# Patient Record
Sex: Male | Born: 1980 | Race: White | Hispanic: No | Marital: Single | Smoking: Current every day smoker
Health system: Southern US, Community
[De-identification: ages and names within clinical notes are randomized; demographics above are authoritative.]

## PROBLEM LIST (undated history)

## (undated) SURGICAL SUPPLY — 3 items
NDL BIOPSY 14X6 SOFT TISS (NEEDLE) IMPLANT
SOLN 0.9% NACL 1000 ML (IV SOLUTION) ×6 IMPLANT
SOLN STERILE WATER 1000 ML (IV SOLUTION) ×1 IMPLANT

---

## 2006-08-15 ENCOUNTER — Emergency Department: Payer: Self-pay | Admitting: Emergency Medicine

## 2008-02-22 ENCOUNTER — Inpatient Hospital Stay: Payer: Self-pay | Admitting: Psychiatry

## 2008-05-14 ENCOUNTER — Emergency Department: Payer: Self-pay | Admitting: Emergency Medicine

## 2008-06-29 ENCOUNTER — Emergency Department: Payer: Self-pay | Admitting: Emergency Medicine

## 2012-04-29 ENCOUNTER — Emergency Department: Payer: Self-pay | Admitting: Emergency Medicine

## 2013-02-07 ENCOUNTER — Emergency Department: Payer: Self-pay | Admitting: Emergency Medicine

## 2013-07-29 ENCOUNTER — Encounter (HOSPITAL_COMMUNITY): Payer: Self-pay | Admitting: Emergency Medicine

## 2013-07-29 ENCOUNTER — Emergency Department (HOSPITAL_COMMUNITY)
Admission: EM | Admit: 2013-07-29 | Discharge: 2013-07-29 | Disposition: A | Discharge status: Home / Self Care | Payer: Medicaid Other | Attending: Emergency Medicine | Admitting: Emergency Medicine

## 2013-07-29 ENCOUNTER — Emergency Department (HOSPITAL_COMMUNITY): Payer: Medicaid Other

## 2013-07-29 DIAGNOSIS — S0180XA Unspecified open wound of other part of head, initial encounter: Secondary | ICD-10-CM | POA: Insufficient documentation

## 2013-07-29 DIAGNOSIS — S99929A Unspecified injury of unspecified foot, initial encounter: Secondary | ICD-10-CM

## 2013-07-29 DIAGNOSIS — Z23 Encounter for immunization: Secondary | ICD-10-CM | POA: Insufficient documentation

## 2013-07-29 DIAGNOSIS — S8990XA Unspecified injury of unspecified lower leg, initial encounter: Secondary | ICD-10-CM | POA: Insufficient documentation

## 2013-07-29 DIAGNOSIS — S1093XA Contusion of unspecified part of neck, initial encounter: Secondary | ICD-10-CM

## 2013-07-29 DIAGNOSIS — S058X9A Other injuries of unspecified eye and orbit, initial encounter: Secondary | ICD-10-CM | POA: Insufficient documentation

## 2013-07-29 DIAGNOSIS — F17200 Nicotine dependence, unspecified, uncomplicated: Secondary | ICD-10-CM | POA: Insufficient documentation

## 2013-07-29 DIAGNOSIS — S0083XA Contusion of other part of head, initial encounter: Secondary | ICD-10-CM | POA: Insufficient documentation

## 2013-07-29 DIAGNOSIS — Y92410 Unspecified street and highway as the place of occurrence of the external cause: Secondary | ICD-10-CM | POA: Insufficient documentation

## 2013-07-29 DIAGNOSIS — S060X0A Concussion without loss of consciousness, initial encounter: Secondary | ICD-10-CM

## 2013-07-29 DIAGNOSIS — IMO0002 Reserved for concepts with insufficient information to code with codable children: Secondary | ICD-10-CM | POA: Insufficient documentation

## 2013-07-29 DIAGNOSIS — S0003XA Contusion of scalp, initial encounter: Secondary | ICD-10-CM | POA: Insufficient documentation

## 2013-07-29 DIAGNOSIS — Y9389 Activity, other specified: Secondary | ICD-10-CM | POA: Insufficient documentation

## 2013-07-29 DIAGNOSIS — T07XXXA Unspecified multiple injuries, initial encounter: Secondary | ICD-10-CM

## 2013-07-29 DIAGNOSIS — S99919A Unspecified injury of unspecified ankle, initial encounter: Secondary | ICD-10-CM

## 2013-07-29 DIAGNOSIS — S0993XA Unspecified injury of face, initial encounter: Secondary | ICD-10-CM | POA: Insufficient documentation

## 2013-07-29 DIAGNOSIS — S199XXA Unspecified injury of neck, initial encounter: Secondary | ICD-10-CM

## 2013-07-29 DIAGNOSIS — L089 Local infection of the skin and subcutaneous tissue, unspecified: Secondary | ICD-10-CM

## 2013-07-29 MED ORDER — TETANUS-DIPHTH-ACELL PERTUSSIS 5-2.5-18.5 LF-MCG/0.5 IM SUSP
0.5000 mL | Freq: Once | INTRAMUSCULAR | Status: AC
  Administered 2013-07-29: 0.5 mL via INTRAMUSCULAR
  Filled 2013-07-29: qty 0.5

## 2013-07-29 MED ORDER — TRAMADOL HCL 50 MG PO TABS: 50.0000 mg | ORAL_TABLET | Freq: Four times a day (QID) | ORAL | Status: DC | PRN

## 2013-07-29 MED ORDER — MORPHINE SULFATE 4 MG/ML IJ SOLN
4.0000 mg | Freq: Once | INTRAMUSCULAR | Status: AC
  Administered 2013-07-29: 4 mg via INTRAMUSCULAR
  Filled 2013-07-29: qty 1

## 2013-07-29 MED ORDER — CEPHALEXIN 500 MG PO CAPS: 500.0000 mg | ORAL_CAPSULE | Freq: Two times a day (BID) | ORAL | Status: DC

## 2013-07-29 MED ORDER — TRAMADOL HCL 50 MG PO TABS
50.0000 mg | ORAL_TABLET | Freq: Once | ORAL | Status: AC
  Administered 2013-07-29: 50 mg via ORAL
  Filled 2013-07-29: qty 1

## 2013-07-29 MED ORDER — OXYCODONE-ACETAMINOPHEN 5-325 MG PO TABS: 1.0000 | ORAL_TABLET | Freq: Four times a day (QID) | ORAL | Status: DC | PRN

## 2013-07-29 NOTE — ED Notes (Signed)
PA Jen at bedside.  
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
## 2013-07-29 NOTE — Discharge Instructions (Signed)
Please follow up with your primary care physician in 1-2 days. If you do not have one please call one listed below. Please have some recheck your right ear wound in 1-2 days to ensure it is improving. Please take pain medication and/or muscle relaxants as prescribed and as needed for pain. Please do not drive on narcotic pain medication or on muscle relaxants. Please take your antibiotic until completion.  Please read all discharge instructions and return precautions.     Concussion, Adult A concussion, or closed-head injury, is a brain injury caused by a direct blow to the head or by a quick and sudden movement (jolt) of the head or neck. Concussions are usually not life-threatening. Even so, the effects of a concussion can be serious. If you have had a concussion before, you are more likely to experience concussion-like symptoms after a direct blow to the head.  CAUSES   Direct blow to the head, such as from running into another player during a soccer game, being hit in a fight, or hitting your head on a hard surface.  A jolt of the head or neck that causes the brain to move back and forth inside the skull, such as in a car crash. SIGNS AND SYMPTOMS  The signs of a concussion can be hard to notice. Early on, they may be missed by you, family members, and health care providers. You may look fine but act or feel differently. Symptoms are usually temporary, but they may last for days, weeks, or even longer. Some symptoms may appear right away while others may not show up for hours or days. Every head injury is different. Symptoms include:   Mild to moderate headaches that will not go away.  A feeling of pressure inside your head.  Having more trouble than usual:   Learning or remembering things you have heard.  Answering questions.  Paying attention or concentrating.   Organizing daily tasks.   Making decisions and solving problems.   Slowness in thinking, acting or reacting,  speaking, or reading.   Getting lost or being easily confused.   Feeling tired all the time or lacking energy (fatigued).   Feeling drowsy.   Sleep disturbances.   Sleeping more than usual.   Sleeping less than usual.   Trouble falling asleep.   Trouble sleeping (insomnia).   Loss of balance or feeling lightheaded or dizzy.   Nausea or vomiting.   Numbness or tingling.   Increased sensitivity to:   Sounds.   Lights.   Distractions.   Vision problems or eyes that tire easily.   Diminished sense of taste or smell.   Ringing in the ears.   Mood changes such as feeling sad or anxious.   Becoming easily irritated or angry for little or no reason.   Lack of motivation.  Seeing or hearing things other people do not see or hear (hallucinations). DIAGNOSIS  Your health care provider can usually diagnose a concussion based on a description of your injury and symptoms. He or she will ask whether you passed out (lost consciousness) and whether you are having trouble remembering events that happened right before and during your injury.  Your evaluation might include:   A brain scan to look for signs of injury to the brain. Even if the test shows no injury, you may still have a concussion.   Blood tests to be sure other problems are not present. TREATMENT   Concussions are usually treated in an emergency department, in  urgent care, or at a clinic. You may need to stay in the hospital overnight for further treatment.   Tell your health care provider if you are taking any medicines, including prescription medicines, over-the-counter medicines, and natural remedies. Some medicines, such as blood thinners (anticoagulants) and aspirin, may increase the chance of complications. Also tell your health care provider whether you have had alcohol or are taking illegal drugs. This information may affect treatment.  Your health care provider will send you home  with important instructions to follow.  How fast you will recover from a concussion depends on many factors. These factors include how severe your concussion is, what part of your brain was injured, your age, and how healthy you were before the concussion.  Most people with mild injuries recover fully. Recovery can take time. In general, recovery is slower in older persons. Also, persons who have had a concussion in the past or have other medical problems may find that it takes longer to recover from their current injury. HOME CARE INSTRUCTIONS  General Instructions  Carefully follow the directions your health care provider gave you.  Only take over-the-counter or prescription medicines for pain, discomfort, or fever as directed by your health care provider.  Take only those medicines that your health care provider has approved.  Do not drink alcohol until your health care provider says you are well enough to do so. Alcohol and certain other drugs may slow your recovery and can put you at risk of further injury.  If it is harder than usual to remember things, write them down.  If you are easily distracted, try to do one thing at a time. For example, do not try to watch TV while fixing dinner.  Talk with family members or close friends when making important decisions.  Keep all follow-up appointments. Repeated evaluation of your symptoms is recommended for your recovery.  Watch your symptoms and tell others to do the same. Complications sometimes occur after a concussion. Older adults with a brain injury may have a higher risk of serious complications such as of a blood clot on the brain.  Tell your teachers, school nurse, school counselor, coach, athletic trainer, or work Production designer, theatre/television/filmmanager about your injury, symptoms, and restrictions. Tell them about what you can or cannot do. They should watch for:   Increased problems with attention or concentration.   Increased difficulty remembering or  learning new information.   Increased time needed to complete tasks or assignments.   Increased irritability or decreased ability to cope with stress.   Increased symptoms.   Rest. Rest helps the brain to heal. Make sure you:  Get plenty of sleep at night. Avoid staying up late at night.  Keep the same bedtime hours on weekends and weekdays.  Rest during the day. Take daytime naps or rest breaks when you feel tired.  Limit activities that require a lot of thought or concentration. These includes   Doing homework or job-related work.   Watching TV.   Working on the computer.  Avoid any situation where there is potential for another head injury (football, hockey, soccer, basketball, martial arts, downhill snow sports and horseback riding). Your condition will get worse every time you experience a concussion. You should avoid these activities until you are evaluated by the appropriate follow-up caregivers. Returning To Your Regular Activities You will need to return to your normal activities slowly, not all at once. You must give your body and brain enough time for recovery.  Do not return to sports or other athletic activities until your health care provider tells you it is safe to do so.  Ask your health care provider when you can drive, ride a bicycle, or operate heavy machinery. Your ability to react may be slower after a brain injury. Never do these activities if you are dizzy.  Ask your health care provider about when you can return to work or school. Preventing Another Concussion It is very important to avoid another brain injury, especially before you have recovered. In rare cases, another injury can lead to permanent brain damage, brain swelling, or death. The risk of this is greatest during the first 7 10 days after a head injury. Avoid injuries by:   Wearing a seat belt when riding in a car.   Drinking alcohol only in moderation.   Wearing a helmet when  biking, skiing, skateboarding, skating, or doing similar activities.  Avoiding activities that could lead to a second concussion, such as contact or recreational sports, until your health care provider says it is OK.  Taking safety measures in your home.   Remove clutter and tripping hazards from floors and stairways.   Use grab bars in bathrooms and handrails by stairs.   Place non-slip mats on floors and in bathtubs.   Improve lighting in dim areas. SEEK MEDICAL CARE IF:   You have increased problems paying attention or concentrating.   You have increased difficulty remembering or learning new information.   You need more time to complete tasks or assignments than before.   You have increased irritability or decreased ability to cope with stress.  You have more symptoms than before. Seek medical care if you have any of the following symptoms for more than 2 weeks after your injury:   Lasting (chronic) headaches.   Dizziness or balance problems.   Nausea.  Vision problems.   Increased sensitivity to noise or light.   Depression or mood swings.   Anxiety or irritability.   Memory problems.   Difficulty concentrating or paying attention.   Sleep problems.   Feeling tired all the time. SEEK IMMEDIATE MEDICAL CARE IF:   You have severe or worsening headaches. These may be a sign of a blood clot in the brain.  You have weakness (even if only in one hand, leg, or part of the face).  You have numbness.  You have decreased coordination.   You vomit repeatedly.  You have increased sleepiness.  One pupil is larger than the other.   You have convulsions.   You have slurred speech.   You have increased confusion. This may be a sign of a blood clot in the brain.  You have increased restlessness, agitation, or irritability.   You are unable to recognize people or places.   You have neck pain.   It is difficult to wake you up.    You have unusual behavior changes.   You lose consciousness. MAKE SURE YOU:   Understand these instructions.  Will watch your condition.  Will get help right away if you are not doing well or get worse. Document Released: 04/24/2003 Document Revised: 10/04/2012 Document Reviewed: 08/24/2012 [facility] Patient Information 2014 [place], [place].    Motor Vehicle Collision  It is common to have multiple bruises and sore muscles after a motor vehicle collision (MVC). These tend to feel worse for the first 24 hours. You may have the most stiffness and soreness over the first several hours. You may also feel worse  when you wake up the first morning after your collision. After this point, you will usually begin to improve with each day. The speed of improvement often depends on the severity of the collision, the number of injuries, and the location and nature of these injuries. HOME CARE INSTRUCTIONS   Put ice on the injured area.  Put ice in a plastic bag.  Place a towel between your skin and the bag.  Leave the ice on for 15-20 minutes, 03-04 times a day.  Drink enough fluids to keep your urine clear or pale yellow. Do not drink alcohol.  Take a warm shower or bath once or twice a day. This will increase blood flow to sore muscles.  You may return to activities as directed by your caregiver. Be careful when lifting, as this may aggravate neck or back pain.  Only take over-the-counter or prescription medicines for pain, discomfort, or fever as directed by your caregiver. Do not use aspirin. This may increase bruising and bleeding. SEEK IMMEDIATE MEDICAL CARE IF:  You have numbness, tingling, or weakness in the arms or legs.  You develop severe headaches not relieved with medicine.  You have severe neck pain, especially tenderness in the middle of the back of your neck.  You have changes in bowel or bladder control.  There is increasing pain in any area of the body.  You  have shortness of breath, lightheadedness, dizziness, or fainting.  You have chest pain.  You feel sick to your stomach (nauseous), throw up (vomit), or sweat.  You have increasing abdominal discomfort.  There is blood in your urine, stool, or vomit.  You have pain in your shoulder (shoulder strap areas).  You feel your symptoms are getting worse. MAKE SURE YOU:   Understand these instructions.  Will watch your condition.  Will get help right away if you are not doing well or get worse. Document Released: 02/01/2005 Document Revised: 04/26/2011 Document Reviewed: 07/01/2010 [facility] Patient Information 2014 [place], [place]. Wound Care Wound care helps prevent pain and infection.  You may need a tetanus shot if:  You cannot remember when you had your last tetanus shot.  You have never had a tetanus shot.  The injury broke your skin. If you need a tetanus shot and you choose not to have one, you may get tetanus. Sickness from tetanus can be serious. HOME CARE   Only take medicine as told by your doctor.  Clean the wound daily with mild soap and water.  Change any bandages (dressings) as told by your doctor.  Put medicated cream and a bandage on the wound as told by your doctor.  Change the bandage if it gets wet, dirty, or starts to smell.  Take showers. Do not take baths, swim, or do anything that puts your wound under water.  Rest and raise (elevate) the wound until the pain and puffiness (swelling) are better.  Keep all doctor visits as told. GET HELP RIGHT AWAY IF:   Yellowish-white fluid (pus) comes from the wound.  Medicine does not lessen your pain.  There is a red streak going away from the wound.  You have a fever. MAKE SURE YOU:   Understand these instructions.  Will watch your condition.  Will get help right away if you are not doing well or get worse. Document Released: 11/11/2007 Document Revised: 04/26/2011 Document Reviewed:  06/07/2010 [facility] Patient Information 2014 [place], [place].

## 2013-07-29 NOTE — ED Notes (Signed)
C-collar removed by Candise BowensJen, PA.

## 2013-07-29 NOTE — ED Notes (Signed)
Pt A&OX4, ambulatory at discharge with steady gait, NAD. Declined wheelchair at discharge.
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
## 2013-07-30 NOTE — ED Provider Notes (Signed)
Medical screening examination/treatment/procedure(s) were performed by non-physician practitioner and as supervising physician I was immediately available for consultation/collaboration.     Rankin Coolman L Kyrus Hyde, MD 07/30/13 1642 

## 2013-08-10 ENCOUNTER — Emergency Department: Payer: Self-pay | Admitting: Internal Medicine

## 2013-08-13 ENCOUNTER — Emergency Department: Payer: Self-pay | Admitting: Emergency Medicine

## 2013-08-20 ENCOUNTER — Emergency Department: Payer: Self-pay | Admitting: Emergency Medicine

## 2013-09-05 ENCOUNTER — Emergency Department: Payer: Self-pay | Admitting: Emergency Medicine

## 2013-09-07 ENCOUNTER — Emergency Department: Payer: Self-pay | Admitting: Emergency Medicine

## 2013-09-07 LAB — BASIC METABOLIC PANEL
Anion Gap: 9 (ref 7–16)
BUN: 9 mg/dL (ref 7–18)
CALCIUM: 8.5 mg/dL (ref 8.5–10.1)
CO2: 25 mmol/L (ref 21–32)
Chloride: 104 mmol/L (ref 98–107)
Creatinine: 1.07 mg/dL (ref 0.60–1.30)
EGFR (African American): >60
GLUCOSE: 84 mg/dL (ref 65–99)
Osmolality: 274 (ref 275–301)
Potassium: 3.7 mmol/L (ref 3.5–5.1)
Sodium: 138 mmol/L (ref 136–145)

## 2013-09-07 LAB — CBC WITH DIFFERENTIAL/PLATELET
Basophil #: 0 10*3/uL (ref 0.0–0.1)
Basophil %: 0.4 %
EOS ABS: 0.2 10*3/uL (ref 0.0–0.7)
Eosinophil %: 1.4 %
HCT: 40.6 % (ref 40.0–52.0)
HGB: 13.6 g/dL (ref 13.0–18.0)
LYMPHS ABS: 1.4 10*3/uL (ref 1.0–3.6)
Lymphocyte %: 12.6 %
MCH: 31.8 pg (ref 26.0–34.0)
MCHC: 33.5 g/dL (ref 32.0–36.0)
MCV: 95 fL (ref 80–100)
Monocyte #: 1.1 x10 3/mm — ABNORMAL HIGH (ref 0.2–1.0)
Monocyte %: 9.6 %
Neutrophil #: 8.5 10*3/uL — ABNORMAL HIGH (ref 1.4–6.5)
Neutrophil %: 76 %
Platelet: 187 10*3/uL (ref 150–440)
RBC: 4.28 10*6/uL — ABNORMAL LOW (ref 4.40–5.90)
RDW: 15.8 % — AB (ref 11.5–14.5)
WBC: 11.2 10*3/uL — ABNORMAL HIGH (ref 3.8–10.6)

## 2013-09-08 ENCOUNTER — Emergency Department: Payer: Self-pay | Admitting: Emergency Medicine

## 2013-09-10 ENCOUNTER — Emergency Department: Payer: Self-pay | Admitting: Emergency Medicine

## 2013-09-11 LAB — WOUND CULTURE

## 2013-10-27 ENCOUNTER — Emergency Department: Payer: Self-pay | Admitting: Emergency Medicine

## 2014-02-16 ENCOUNTER — Emergency Department: Payer: Self-pay | Admitting: Student

## 2014-04-21 ENCOUNTER — Emergency Department: Payer: Self-pay | Admitting: Student

## 2014-04-22 ENCOUNTER — Emergency Department: Payer: Self-pay | Admitting: Emergency Medicine

## 2015-03-14 IMAGING — CR DG CHEST 2V
1 series · 2 of 2 positions shown · non-contrast
Comparison: 02/21/2008.

CLINICAL DATA: Right rib pain after fall.

EXAM:
CHEST  2 VIEW

[Series 1: w chest pa · 0.14mm/px · 2 of 2 slices shown]
[im 1/2]
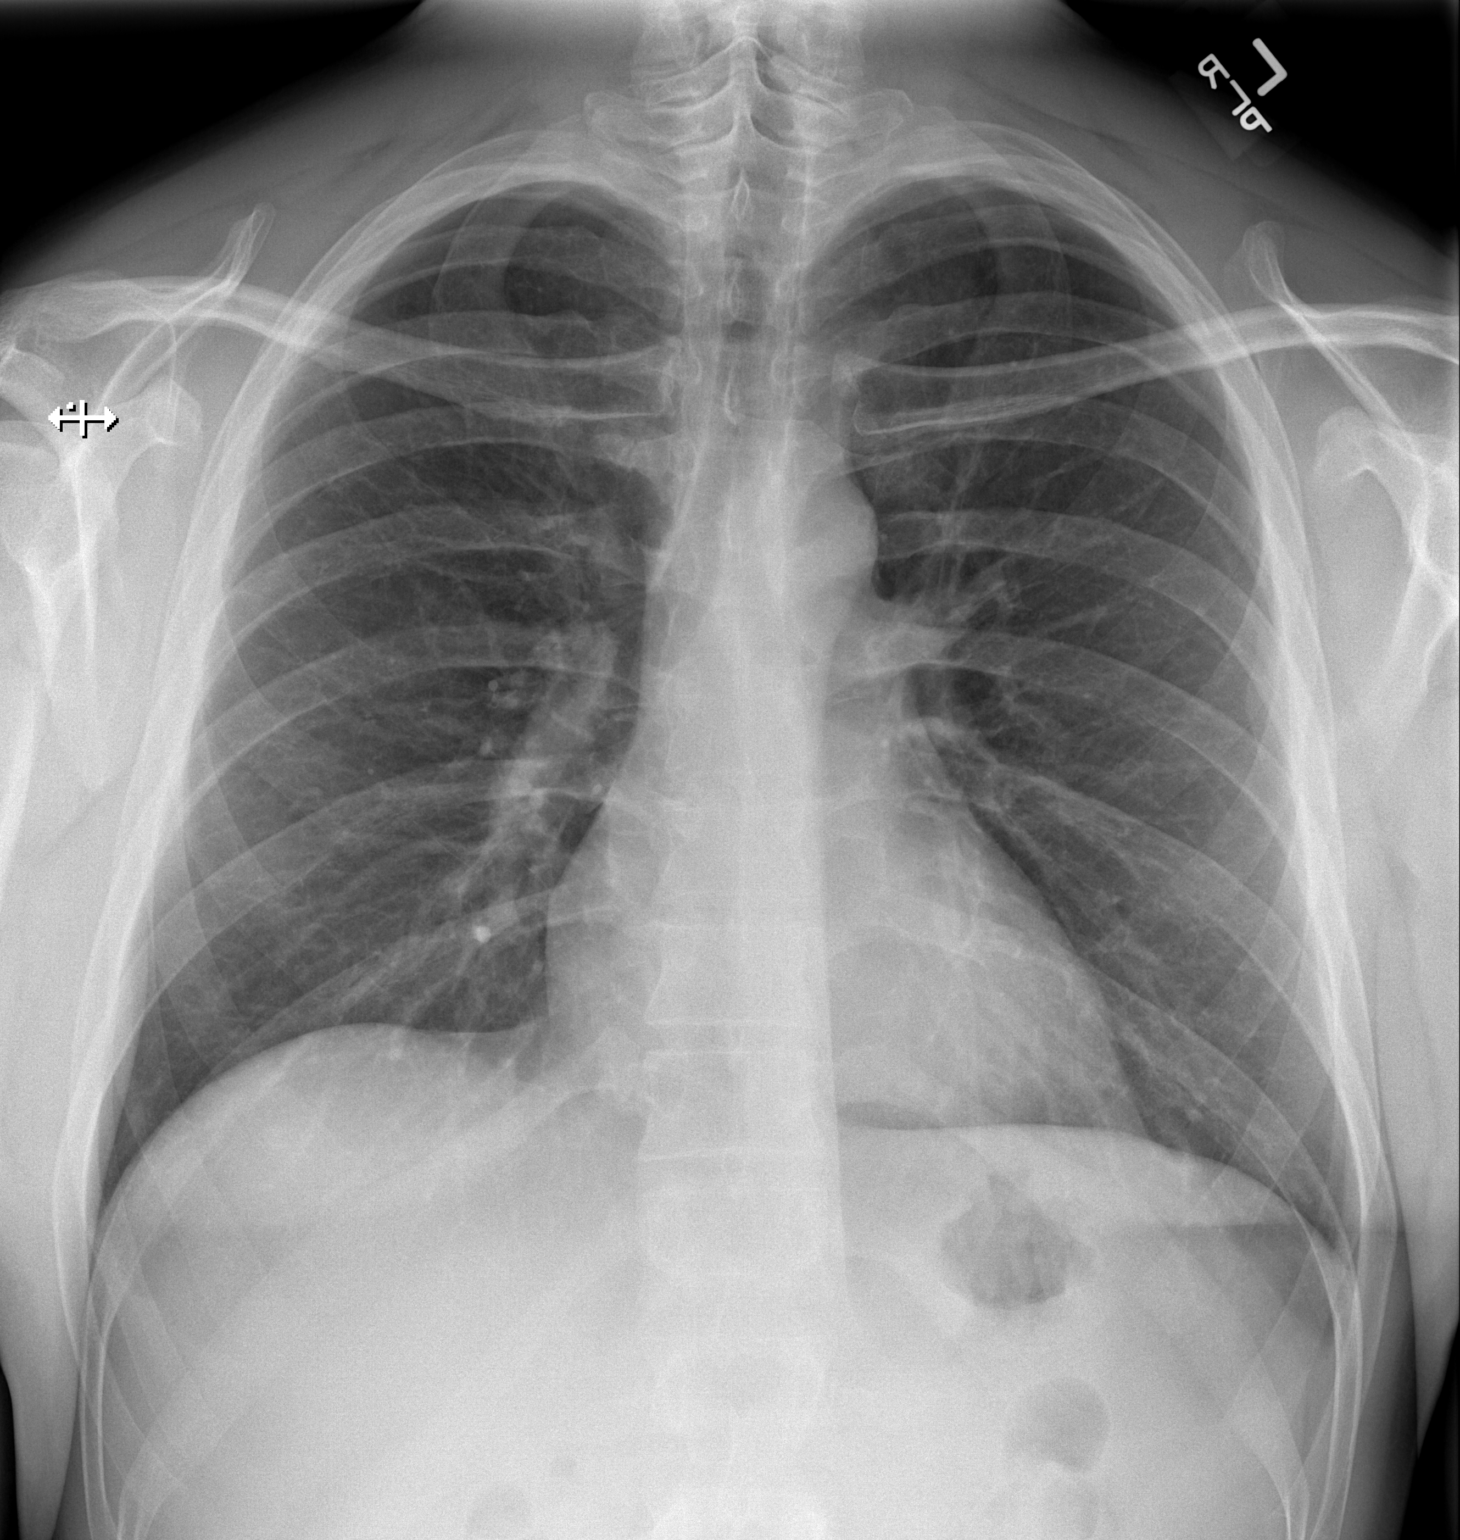
[im 2/2]
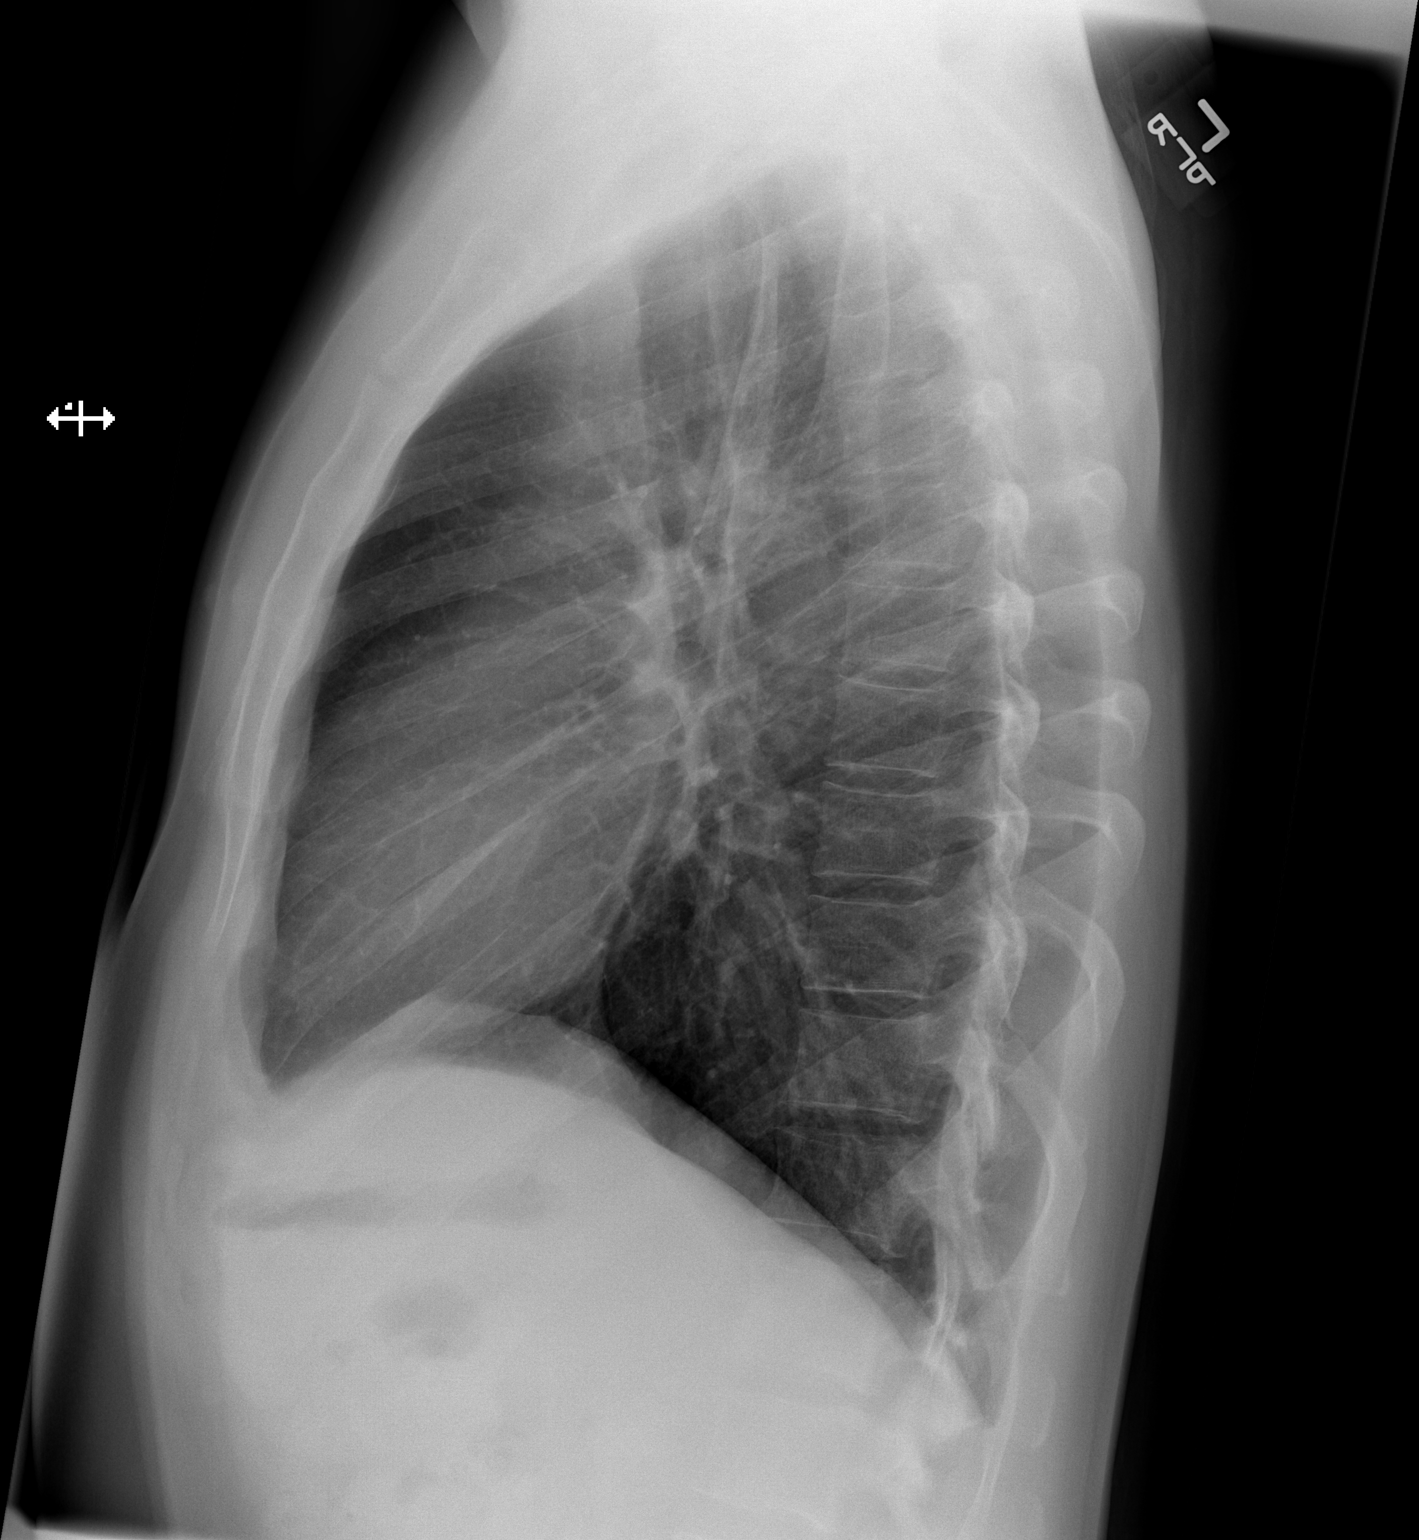

[2 of 2 positions shown; findings below may reference images not displayed]

FINDINGS: The lungs are clear without focal infiltrate, edema, pneumothorax or
pleural effusion. The cardiopericardial silhouette is within normal
limits for size. Imaged bony structures of the thorax are intact.
IMPRESSION: No active cardiopulmonary disease.

## 2019-06-13 ENCOUNTER — Ambulatory Visit: Payer: Self-pay

## 2023-05-01 ENCOUNTER — Other Ambulatory Visit: Payer: Self-pay

## 2023-05-01 ENCOUNTER — Emergency Department

## 2023-05-01 ENCOUNTER — Emergency Department
Admission: EM | Admit: 2023-05-01 | Discharge: 2023-05-02 | Disposition: A | Discharge status: Other Acute Inpatient Hospital | Attending: Emergency Medicine | Admitting: Emergency Medicine

## 2023-05-01 DIAGNOSIS — X838XXA Intentional self-harm by other specified means, initial encounter: Secondary | ICD-10-CM | POA: Insufficient documentation

## 2023-05-01 DIAGNOSIS — T71164A Asphyxiation due to hanging, undetermined, initial encounter: Secondary | ICD-10-CM | POA: Insufficient documentation

## 2023-05-01 DIAGNOSIS — R45851 Suicidal ideations: Secondary | ICD-10-CM | POA: Insufficient documentation

## 2023-05-01 DIAGNOSIS — F32A Depression, unspecified: Secondary | ICD-10-CM

## 2023-05-01 DIAGNOSIS — Z046 Encounter for general psychiatric examination, requested by authority: Secondary | ICD-10-CM

## 2023-05-01 LAB — COMPREHENSIVE METABOLIC PANEL
ALT: 26 U/L (ref 0–44)
AST: 22 U/L (ref 15–41)
Albumin: 4 g/dL (ref 3.5–5.0)
Alkaline Phosphatase: 53 U/L (ref 38–126)
Anion gap: 8 (ref 5–15)
BUN: 15 mg/dL (ref 6–20)
CO2: 27 mmol/L (ref 22–32)
Calcium: 8.7 mg/dL — ABNORMAL LOW (ref 8.9–10.3)
Chloride: 94 mmol/L — ABNORMAL LOW (ref 98–111)
Creatinine, Ser: 1.19 mg/dL (ref 0.61–1.24)
GFR, Estimated: >60 mL/min (ref >60)
Glucose, Bld: 96 mg/dL (ref 70–99)
Potassium: 3.7 mmol/L (ref 3.5–5.1)
Sodium: 129 mmol/L — ABNORMAL LOW (ref 135–145)
Total Bilirubin: 1.2 mg/dL (ref 0.0–1.2)
Total Protein: 7 g/dL (ref 6.5–8.1)

## 2023-05-01 LAB — CBC
HCT: 43.8 % (ref 39.0–52.0)
Hemoglobin: 14.4 g/dL (ref 13.0–17.0)
MCH: 27.7 pg (ref 26.0–34.0)
MCHC: 32.9 g/dL (ref 30.0–36.0)
MCV: 84.4 fL (ref 80.0–100.0)
Platelets: 205 10*3/uL (ref 150–400)
RBC: 5.19 MIL/uL (ref 4.22–5.81)
RDW: 13.7 % (ref 11.5–15.5)
WBC: 5.7 10*3/uL (ref 4.0–10.5)
nRBC: 0 % (ref 0.0–0.2)

## 2023-05-01 LAB — SALICYLATE LEVEL: Salicylate Lvl: <7 mg/dL — ABNORMAL LOW (ref 7.0–30.0)

## 2023-05-01 LAB — ACETAMINOPHEN LEVEL: Acetaminophen (Tylenol), Serum: <10 ug/mL — ABNORMAL LOW (ref 10–30)

## 2023-05-01 LAB — ETHANOL: Alcohol, Ethyl (B): <10 mg/dL (ref <10)

## 2023-05-01 MED ORDER — IOHEXOL 350 MG/ML SOLN
75.0000 mL | Freq: Once | INTRAVENOUS | Status: AC | PRN
  Administered 2023-05-02: 75 mL via INTRAVENOUS

## 2023-05-01 MED ORDER — SODIUM CHLORIDE 0.9 % IV BOLUS
1000.0000 mL | Freq: Once | INTRAVENOUS | Status: AC
  Administered 2023-05-01: 1000 mL via INTRAVENOUS

## 2023-05-01 NOTE — BH Assessment (Deleted)
 Patient has not yet been medically cleared. TTS will assess once patient has been medically cleared.

## 2023-05-01 NOTE — ED Notes (Addendum)
 Pt dressed out:  Blue & white shoes Blue jeans Blue shirt Sunglasses White metal watch Blue boxers Grey socks  Pt wearing blank ankle monitor bc he is on probation. Galaxy 15 phone no cracked screen

## 2023-05-01 NOTE — ED Provider Notes (Incomplete)
 [facility] Provider Note    Event Date/Time   First MD Initiated Contact with Patient 05/01/23 2307     (approximate)   History   Psychiatric Evaluation   HPI  CORT DRAGOO is a 43 y.o. male   Past medical history of no reported past medical history presents emergency department under police IVC for possible suicide attempt.  Per IVC report he had a well check performed on him when his spouse received a video of him with an attempted hanging.  He was brought here by the police.  He states that he saw the video but does not recall performing those actions.  He denies suicidality homicidality or AVH.  He wants to go home.  He denies drug or alcohol use and denies any other acute medical complaints.  He has no hoarse voice, neck pain, vision changes.  External Medical Documents Reviewed: IVC report as above      Physical Exam   Triage Vital Signs: ED Triage Vitals  Encounter Vitals Group     BP 05/01/23 1923 (!) 131/93     Systolic BP Percentile --      Diastolic BP Percentile --      Pulse Rate 05/01/23 1923 68     Resp 05/01/23 1923 16     Temp 05/01/23 1923 97.6 F (36.4 C)     Temp Source 05/01/23 1923 Oral     SpO2 05/01/23 1923 98 %     Weight 05/01/23 1924 245 lb (111.1 kg)     Height 05/01/23 1924 5\' 10"  (1.778 m)     Head Circumference --      Peak Flow --      Pain Score 05/01/23 1923 0     Pain Loc --      Pain Education --      Exclude from Growth Chart --     Most recent vital signs: Vitals:   05/01/23 1923  BP: (!) 131/93  Pulse: 68  Resp: 16  Temp: 97.6 F (36.4 C)  SpO2: 98%    General: Awake, no distress.  CV:  Good peripheral perfusion.  Resp:  Normal effort.  Abd:  No distention.  Other:  Phonation is normal neck supple full range of motion no external signs of trauma.  Lungs clear soft nontender abdomen.   ED Results / Procedures / Treatments   Labs (all labs ordered are listed, but only abnormal  results are displayed) Labs Reviewed  COMPREHENSIVE METABOLIC PANEL - Abnormal; Notable for the following components:      Result Value   Sodium 129 (*)    Chloride 94 (*)    Calcium 8.7 (*)    All other components within normal limits  SALICYLATE LEVEL - Abnormal; Notable for the following components:   Salicylate Lvl <7.0 (*)    All other components within normal limits  ACETAMINOPHEN LEVEL - Abnormal; Notable for the following components:   Acetaminophen (Tylenol), Serum <10 (*)    All other components within normal limits  ETHANOL  CBC  URINE DRUG SCREEN, QUALITATIVE ([facility] ONLY)     I ordered and reviewed the above labs they are notable for hyponatremia 129 otherwise cell counts electrolytes unremarkable.  Salicylate Tylenol levels negative.  RADIOLOGY I independently reviewed and interpreted CT of the head see no obvious bleeding or midline shift I also reviewed radiologist's formal read.   PROCEDURES:  Critical Care performed: No  Procedures   MEDICATIONS ORDERED IN ED:  Medications  sodium chloride 0.9 % bolus 1,000 mL (1,000 mLs Intravenous New Bag/Given 05/01/23 2334)  iohexol (OMNIPAQUE) 350 MG/ML injection 75 mL (75 mLs Intravenous Contrast Given 05/02/23 0003)    IMPRESSION / MDM / ASSESSMENT AND PLAN / ED COURSE  I reviewed the triage vital signs and the nursing notes.                                Patient's presentation is most consistent with acute presentation with potential threat to life or bodily function.  Differential diagnosis includes, but is not limited to, suicide attempt, acute decompensated psychiatric illness, neck injury vessel injury considered but less likely   The patient is on the cardiac monitor to evaluate for evidence of arrhythmia and/or significant heart rate changes.  MDM:   Patient with reported suicide attempt with some nondescript attempted hanging video taken I was not able to see the video and patient adamantly denies  any recollection of doing so or suicidality.  He does acknowledge that he has seen the video and recognizes himself in the video but denies ever performing those actions.  He looks well there is no signs of neck trauma he has no other acute medical complaints and he is mildly hyponatremic but otherwise is labs are unremarkable.  I am going to get a CT angiogram of his head neck for this reported hanging attempt and his altered mental status vs. denying as he really wants to leave the hospital at this time.  I told him he is under IVC and he expresses understanding.  Will be medically cleared pending a normal CT angiogram of the neck/head for psych Evaluation   -- CT normal.  Medically clear for psychiatric evaluation.  FINAL CLINICAL IMPRESSION(S) / ED DIAGNOSES   Final diagnoses:  Suicidal ideation  Hanging, initial encounter     Rx / DC Orders   ED Discharge Orders     None        Note:  This document was prepared using Dragon voice recognition software and may include unintentional dictation errors.    Pilar Jarvis, MD 05/02/23 Newton Pigg    Pilar Jarvis, MD 05/02/23 [phone]

## 2023-05-01 NOTE — ED Triage Notes (Signed)
 Pt to ed from home via [employer] for IVC for suicidal. Pt has video of him attempting to hang himself. Pt is alert and oriented and in no distress. The GF recorded said video.

## 2023-05-01 NOTE — Consult Note (Incomplete)
 [facility] Psychiatric Consult Initial  Patient Name: [name]  MRN: [id number]  DOB: 01/10/1981  Consult Order details:  Orders (From admission, onward)     Start     Ordered   05/01/23 1925  CONSULT TO CALL ACT TEAM       Ordering Provider: Dionne Bucy, MD  Provider:  (Not yet assigned)  Question:  Reason for Consult?  Answer:  IVC   05/01/23 1925             Mode of Visit: Tele-visit Virtual Statement:TELE PSYCHIATRY ATTESTATION & CONSENT As the provider for this telehealth consult, I attest that I verified the patient's identity using two separate identifiers, introduced myself to the patient, provided my credentials, disclosed my location, and performed this encounter via a HIPAA-compliant, real-time, face-to-face, two-way, interactive audio and video platform and with the full consent and agreement of the patient (or guardian as applicable.) Patient physical location: [facility]. Telehealth provider physical location: home office in state of [place].   Video start time:   Video end time:      Psychiatry Consult Evaluation  Service Date: May 01, 2023 LOS:  LOS: 0 days  Chief Complaint "Here for a video" - patient denies suicidality.  Primary Psychiatric Diagnoses  *** 2.  *** 3.  ***  Assessment  Joe Keith is a 43 y.o. male admitted: {CHL BH Medical or Presented to MW:41324}MWN No admission date for patient encounter. for ***. He carries the psychiatric diagnoses of *** and has a past medical history of  ***.   His current presentation of *** is most consistent with ***. He meets criteria for *** based on ***.  Current outpatient psychotropic medications include *** and historically he has had a *** response to these medications. He was *** compliant with medications prior to admission as evidenced by ***. On initial examination, patient ***. Please see plan below for detailed recommendations.   Diagnoses:  Active Hospital problems: Active Problems:   * No  active hospital problems. *    Plan   ## Psychiatric Medication Recommendations:  ***  ## Medical Decision Making Capacity: {CHL BH MEDICAL DECISION MAKING CAPACITY:31818}  ## Further Work-up:  -- *** {CHLmacgeneralandspecificworkuprecs:31821} -- most recent EKG on *** had QtC of *** -- Pertinent labwork reviewed earlier this admission includes: ***   ## Disposition:-- {CHLmaccldispo:31820}  ## Behavioral / Environmental: -{CHLmacbehavioralenvironmental2:31847}    ## Safety and Observation Level:  - Based on my clinical evaluation, I estimate the patient to be at *** risk of self harm in the current setting. - At this time, we recommend  {CHL BH SUICIDE OBSERVATION LEVEL:31850}. This decision is based on my review of the chart including patient's history and current presentation, interview of the patient, mental status examination, and consideration of suicide risk including evaluating suicidal ideation, plan, intent, suicidal or self-harm behaviors, risk factors, and protective factors. This judgment is based on our ability to directly address suicide risk, implement suicide prevention strategies, and develop a safety plan while the patient is in the clinical setting. Please contact our team if there is a concern that risk level has changed.  CSSR Risk Category:C-SSRS RISK CATEGORY: No Risk  Suicide Risk Assessment: Patient has following modifiable risk factors for suicide: {CHLmacmodifiablesuicideriskfactors:31822}, which we are addressing by ***. Patient has following non-modifiable or demographic risk factors for suicide: {CHLmacnonmodifiablesuicideriskfactors:31823} Patient has the following protective factors against suicide: {CHLmacprotectivefactors:31824}  Thank you for this consult request. Recommendations have been communicated to the  primary team.  We will *** at this time.   Jearld Lesch, NP       History of Present Illness  Relevant Aspects of Hospital [facility] or ED course:31819} Course:  Admitted on (Not on file) for ***. They ***.   Patient Report:  ***  Psych ROS:  Depression: *** Anxiety:  *** Mania (lifetime and current): *** Psychosis: (lifetime and current): ***  Collateral information:  Contacted *** at *** on ***  Review of Systems  All other systems reviewed and are negative.    Psychiatric and Social History  Psychiatric History:  Information collected from ***  Prev Dx/Sx: *** Current Psych Provider: *** Home Meds (current): *** Previous Med Trials: *** Therapy: ***  Prior Psych Hospitalization: ***  Prior Self Harm: *** Prior Violence: ***  Family Psych History: *** Family Hx suicide: ***  Social History:  Developmental Hx: *** Educational Hx: *** Occupational Hx: *** Legal Hx: *** Living Situation: *** Spiritual Hx: *** Access to weapons/lethal means: ***   Substance History Alcohol: ***  Type of alcohol *** Last Drink *** Number of drinks per day *** History of alcohol withdrawal seizures *** History of DT's *** Tobacco: *** Illicit drugs: *** Prescription drug abuse: *** Rehab hx: ***  Exam Findings  Physical Exam: *** Vital Signs:  Temp:  [97.6 F (36.4 C)] 97.6 F (36.4 C) (03/16 1923) Pulse Rate:  [68] 68 (03/16 1923) Resp:  [16] 16 (03/16 1923) BP: (131)/(93) 131/93 (03/16 1923) SpO2:  [98 %] 98 % (03/16 1923) Weight:  [111.1 kg] 111.1 kg (03/16 1924) Blood pressure (!) 131/93, pulse 68, temperature 97.6 F (36.4 C), temperature source Oral, resp. rate 16, height 5\' 10"  (1.778 m), weight 111.1 kg, SpO2 98%. Body mass index is 35.15 kg/m.  Physical Exam  Mental Status Exam: General Appearance: {Appearance:22683}  Orientation:  {BHH ORIENTATION (PAA):22689}  Memory:  {BHH MEMORY:22881}  Concentration:  {Concentration:21399}  Recall:  {BHH GOOD/FAIR/POOR:22877}  Attention  {BH Attention Span:31825}  Eye Contact:  {BHH EYE CONTACT:22684}  Speech:  {Speech:22685}   Language:  {BHH GOOD/FAIR/POOR:22877}  Volume:  {Volume (PAA):22686}  Mood: ***  Affect:  {Affect (PAA):22687}  Thought Process:  {Thought Process (PAA):22688}  Thought Content:  {Thought Content:22690}  Suicidal Thoughts:  {ST/HT (PAA):22692}  Homicidal Thoughts:  {ST/HT (PAA):22692}  Judgement:  {Judgement (PAA):22694}  Insight:  {Insight (PAA):22695}  Psychomotor Activity:  {Psychomotor (PAA):22696}  Akathisia:  {BHH YES OR NO:22294}  Fund of Knowledge:  {BHH GOOD/FAIR/POOR:22877}      Assets:  {Assets (PAA):22698}  Cognition:  {chl bhh cognition:304700322}  ADL's:  {BHH WUJ'W:11914}  AIMS (if indicated):        Other History   These have been pulled in through the EMR, reviewed, and updated if appropriate.  Family History:  The patient's family history is not on file.  Medical History: History reviewed. No pertinent past medical history.  Surgical History: History reviewed. No pertinent surgical history.   Medications:  No current facility-administered medications for this encounter.  Current Outpatient Medications:  .  acetaminophen (TYLENOL) 500 MG tablet, Take 1,000 mg by mouth every 6 (six) hours as needed for moderate pain., Disp: , Rfl:  .  Aspirin-Acetaminophen-Caffeine (GOODY HEADACHE PO), Take 1 Package by mouth daily as needed (for headache)., Disp: , Rfl:  .  cephALEXin (KEFLEX) 500 MG capsule, Take 1 capsule (500 mg total) by mouth 2 (two) times daily., Disp: 40 capsule, Rfl: 0 .  ibuprofen (ADVIL,MOTRIN) 200 MG tablet,  Take 400 mg by mouth every 6 (six) hours as needed for moderate pain., Disp: , Rfl:  .  oxyCODONE-acetaminophen (PERCOCET) 5-325 MG per tablet, Take 1-2 tablets by mouth every 6 (six) hours as needed for severe pain., Disp: 20 tablet, Rfl: 0  Allergies: Allergies  Allergen Reactions  . Codeine Hives and Nausea Only    Jearld Lesch, NP

## 2023-05-01 NOTE — Consult Note (Signed)
 [facility] Psychiatric Consult Initial  Patient Name: .GEORGES VICTORIO  MRN: [id number]  DOB: 07/16/1980  Consult Order details:  Orders (From admission, onward)     Start     Ordered   05/01/23 1925  CONSULT TO CALL ACT TEAM       Ordering Provider: Dionne Bucy, MD  Provider:  (Not yet assigned)  Question:  Reason for Consult?  Answer:  IVC   05/01/23 1925             Mode of Visit: Tele-visit Virtual Statement:TELE PSYCHIATRY ATTESTATION & CONSENT As the provider for this telehealth consult, I attest that I verified the patient's identity using two separate identifiers, introduced myself to the patient, provided my credentials, disclosed my location, and performed this encounter via a HIPAA-compliant, real-time, face-to-face, two-way, interactive audio and video platform and with the full consent and agreement of the patient (or guardian as applicable.) Patient physical location: [facility] ER. Telehealth provider physical location: home office in state of [place].   Video start time:   Video end time:      Psychiatry Consult Evaluation  Service Date: May 02, 2023 LOS:  LOS: 0 days  Chief Complaint "Here for a video" - patient denies suicidality.  Primary Psychiatric Diagnoses  Involuntary commitment  Assessment  Zaveon Gillen is a 43 year old male brought in for psychiatric evaluation after a reported suicide attempt. While the patient denies SI and minimizes the event, the presence of a recorded act resembling self-harm raises concerns for impulsivity and underlying psychiatric distress. He lacks insight into the potential consequences of his actions and presents with questionable judgment. However, at the time of evaluation, he denies acute suicidality, appears pleasant and cooperative, and does not exhibit psychotic symptoms.  Diagnoses:  Active Hospital problems: Principal Problem:   Involuntary commitment    Plan   ## Psychiatric Medication Recommendations:  Patient  is not currently on medications  ## Medical Decision Making Capacity: Not specifically addressed in this encounter  ## Further Work-up:  TSH, B12, folate or UDS -- Pertinent labwork reviewed earlier this admission includes: NA   ## Disposition:-- Reassessment pending collateral  ## Behavioral / Environmental: - No specific recommendations at this time.     ## Safety and Observation Level:  - Based on my clinical evaluation, I estimate the patient to be at low risk of self harm in the current setting. - At this time, we recommend  routine. This decision is based on my review of the chart including patient's history and current presentation, interview of the patient, mental status examination, and consideration of suicide risk including evaluating suicidal ideation, plan, intent, suicidal or self-harm behaviors, risk factors, and protective factors. This judgment is based on our ability to directly address suicide risk, implement suicide prevention strategies, and develop a safety plan while the patient is in the clinical setting. Please contact our team if there is a concern that risk level has changed.  CSSR Risk Category:C-SSRS RISK CATEGORY: No Risk  Suicide Risk Assessment: Patient has following modifiable risk factors for suicide: recklessness.  Patient has following non-modifiable or demographic risk factors for suicide: male gender Patient has the following protective factors against suicide: Supportive family, Supportive friends, Cultural, spiritual, or religious beliefs that discourage suicide, no history of suicide attempts, and no history of NSSIB  Thank you for this consult request. Recommendations have been communicated to the primary team.  We will reassess in the am at this time.   Elray Buba  Durwin Nora, NP       History of Present Illness  Akshat Minehart is a 43 year old male who was brought to the Emergency Department (ED) by [employer] under an IVC order following concerns about a  suicide attempt. His girlfriend recorded a video in which he appeared to be attempting to hang himself. The patient reports that he is here "for a video," minimizes the event, and denies suicidal intent. He acknowledges that something was wrapped around his neck but states he was "messing around" and "posing" with a cellphone cord while keeping his feet on the ground. He is adamant that he does not believe in suicide, citing religious beliefs.  The patient denies any history of suicide attempts, current suicidal or homicidal ideation (SI/HI), and psychotic symptoms (AVH). He has been out of prison since July 19th and states that he has been doing well since his release. He denies any prior psychiatric history and is not on psychotropic medications.  Psych ROS:  Depression: denies Anxiety:  denies Mania (lifetime and current): denies Psychosis: (lifetime and current): denies  Collateral information: Pending   Review of Systems  All other systems reviewed and are negative.    Psychiatric and Social History  Psychiatric History:  Information collected from patient  Hospitalizations: None reported Suicide Attempts: Denied Outpatient Treatment: None Psychotropic Medications: None History of Self-Harm: Denied   Social History:  Living Situation: Unknown Employment: Unknown Legal History: Recently released from prison (July 19th) Support System: Unknown Access to weapons/lethal means: denies   Substance History Alcohol: denies  Tobacco: denies Illicit drugs: denies Prescription drug abuse: denies  Exam Findings  Physical Exam:  Vital Signs:  Temp:  [97.6 F (36.4 C)] 97.6 F (36.4 C) (03/16 1923) Pulse Rate:  [68] 68 (03/16 1923) Resp:  [16] 16 (03/16 1923) BP: (131)/(93) 131/93 (03/16 1923) SpO2:  [98 %] 98 % (03/16 1923) Weight:  [111.1 kg] 111.1 kg (03/16 1924) Blood pressure (!) 131/93, pulse 68, temperature 97.6 F (36.4 C), temperature source Oral, resp. rate 16,  height 5\' 10"  (1.778 m), weight 111.1 kg, SpO2 98%. Body mass index is 35.15 kg/m.  Physical Exam Vitals and nursing note reviewed.  Constitutional:      Appearance: Normal appearance. He is normal weight.  HENT:     Head: Normocephalic and atraumatic.     Mouth/Throat:     Pharynx: Oropharynx is clear.  Eyes:     Pupils: Pupils are equal, round, and reactive to light.  Pulmonary:     Effort: Pulmonary effort is normal.  Musculoskeletal:        General: Normal range of motion.     Cervical back: Normal range of motion.  Skin:    General: Skin is dry.  Neurological:     Mental Status: He is alert and oriented to person, place, and time.  Psychiatric:        Attention and Perception: Attention and perception normal.        Mood and Affect: Mood and affect normal.        Speech: Speech normal.        Behavior: Behavior normal. Behavior is cooperative.        Thought Content: Thought content normal. Thought content does not include homicidal or suicidal ideation. Thought content does not include homicidal or suicidal plan.        Cognition and Memory: Cognition and memory normal.        Judgment: Judgment normal.     Mental  Status Exam: General Appearance: Casual  Orientation:  Full (Time, Place, and Person)  Memory:  Immediate;   Fair Recent;   Fair  Concentration:  Concentration: Fair and Attention Span: Fair  Recall:  Fair  Attention  Good  Eye Contact:  Good  Speech:  Clear and Coherent  Language:  Good  Volume:  Normal  Mood: euthymic  Affect:  Appropriate  Thought Process:  Coherent  Thought Content:  WDL  Suicidal Thoughts:  No  Homicidal Thoughts:  No  Judgement:  Good  Insight:  Good  Psychomotor Activity:  Normal  Akathisia:  NA  Fund of Knowledge:  NA      Assets:  Solicitor Physical Health Social Support  Cognition:  WNL  ADL's:  Intact  AIMS (if indicated):        Other History   These have  been pulled in through the EMR, reviewed, and updated if appropriate.  Family History:  The patient's family history is not on file.  Medical History: History reviewed. No pertinent past medical history.  Surgical History: History reviewed. No pertinent surgical history.   Medications:  No current facility-administered medications for this encounter.  Current Outpatient Medications:    acetaminophen (TYLENOL) 500 MG tablet, Take 1,000 mg by mouth every 6 (six) hours as needed for moderate pain., Disp: , Rfl:    Aspirin-Acetaminophen-Caffeine (GOODY HEADACHE PO), Take 1 Package by mouth daily as needed (for headache)., Disp: , Rfl:    cephALEXin (KEFLEX) 500 MG capsule, Take 1 capsule (500 mg total) by mouth 2 (two) times daily., Disp: 40 capsule, Rfl: 0   ibuprofen (ADVIL,MOTRIN) 200 MG tablet, Take 400 mg by mouth every 6 (six) hours as needed for moderate pain., Disp: , Rfl:    oxyCODONE-acetaminophen (PERCOCET) 5-325 MG per tablet, Take 1-2 tablets by mouth every 6 (six) hours as needed for severe pain., Disp: 20 tablet, Rfl: 0  Allergies: Allergies  Allergen Reactions   Codeine Hives and Nausea Only    Nava Song Damaris Hippo, NP

## 2023-05-02 ENCOUNTER — Encounter: Payer: Self-pay | Admitting: Psychiatry

## 2023-05-02 ENCOUNTER — Inpatient Hospital Stay
Admission: AD | Admit: 2023-05-02 | Discharge: 2023-05-06 | DRG: 885 | Disposition: A | Discharge status: Home / Self Care | Source: Intra-hospital | Attending: Psychiatry | Admitting: Psychiatry

## 2023-05-02 ENCOUNTER — Emergency Department

## 2023-05-02 DIAGNOSIS — F331 Major depressive disorder, recurrent, moderate: Principal | ICD-10-CM | POA: Insufficient documentation

## 2023-05-02 DIAGNOSIS — Z6835 Body mass index (BMI) 35.0-35.9, adult: Secondary | ICD-10-CM

## 2023-05-02 DIAGNOSIS — F410 Panic disorder [episodic paroxysmal anxiety] without agoraphobia: Secondary | ICD-10-CM | POA: Diagnosis present

## 2023-05-02 DIAGNOSIS — Z885 Allergy status to narcotic agent status: Secondary | ICD-10-CM

## 2023-05-02 DIAGNOSIS — F1510 Other stimulant abuse, uncomplicated: Secondary | ICD-10-CM | POA: Insufficient documentation

## 2023-05-02 DIAGNOSIS — Z5941 Food insecurity: Secondary | ICD-10-CM | POA: Diagnosis not present

## 2023-05-02 DIAGNOSIS — Z5948 Other specified lack of adequate food: Secondary | ICD-10-CM

## 2023-05-02 DIAGNOSIS — F5105 Insomnia due to other mental disorder: Secondary | ICD-10-CM | POA: Diagnosis present

## 2023-05-02 DIAGNOSIS — F411 Generalized anxiety disorder: Secondary | ICD-10-CM | POA: Insufficient documentation

## 2023-05-02 DIAGNOSIS — Z765 Malingerer [conscious simulation]: Secondary | ICD-10-CM | POA: Diagnosis not present

## 2023-05-02 DIAGNOSIS — Z7982 Long term (current) use of aspirin: Secondary | ICD-10-CM

## 2023-05-02 DIAGNOSIS — T71164A Asphyxiation due to hanging, undetermined, initial encounter: Secondary | ICD-10-CM | POA: Diagnosis present

## 2023-05-02 DIAGNOSIS — Z79899 Other long term (current) drug therapy: Secondary | ICD-10-CM | POA: Diagnosis not present

## 2023-05-02 DIAGNOSIS — F329 Major depressive disorder, single episode, unspecified: Principal | ICD-10-CM | POA: Diagnosis present

## 2023-05-02 DIAGNOSIS — Z046 Encounter for general psychiatric examination, requested by authority: Secondary | ICD-10-CM

## 2023-05-02 DIAGNOSIS — X838XXA Intentional self-harm by other specified means, initial encounter: Secondary | ICD-10-CM

## 2023-05-02 DIAGNOSIS — Z5982 Transportation insecurity: Secondary | ICD-10-CM

## 2023-05-02 DIAGNOSIS — Z653 Problems related to other legal circumstances: Secondary | ICD-10-CM | POA: Diagnosis not present

## 2023-05-02 LAB — COMPREHENSIVE METABOLIC PANEL
ALT: 23 U/L (ref 0–44)
AST: 19 U/L (ref 15–41)
Albumin: 3.6 g/dL (ref 3.5–5.0)
Alkaline Phosphatase: 49 U/L (ref 38–126)
Anion gap: 6 (ref 5–15)
BUN: 10 mg/dL (ref 6–20)
CO2: 27 mmol/L (ref 22–32)
Calcium: 8.5 mg/dL — ABNORMAL LOW (ref 8.9–10.3)
Chloride: 103 mmol/L (ref 98–111)
Creatinine, Ser: 1.18 mg/dL (ref 0.61–1.24)
GFR, Estimated: >60 mL/min (ref >60)
Glucose, Bld: 125 mg/dL — ABNORMAL HIGH (ref 70–99)
Potassium: 4 mmol/L (ref 3.5–5.1)
Sodium: 135 mmol/L (ref 135–145)
Total Bilirubin: 1.5 mg/dL — ABNORMAL HIGH (ref 0.0–1.2)
Total Protein: 6.3 g/dL — ABNORMAL LOW (ref 6.5–8.1)

## 2023-05-02 LAB — URINE DRUG SCREEN, QUALITATIVE (ARMC ONLY)
Amphetamines, Ur Screen: POSITIVE — AB
Barbiturates, Ur Screen: NOT DETECTED
Benzodiazepine, Ur Scrn: NOT DETECTED
Cannabinoid 50 Ng, Ur ~~LOC~~: NOT DETECTED
Cocaine Metabolite,Ur ~~LOC~~: NOT DETECTED
MDMA (Ecstasy)Ur Screen: NOT DETECTED
Methadone Scn, Ur: NOT DETECTED
Opiate, Ur Screen: NOT DETECTED
Phencyclidine (PCP) Ur S: NOT DETECTED
Tricyclic, Ur Screen: NOT DETECTED

## 2023-05-02 MED ORDER — HALOPERIDOL 5 MG PO TABS: 5.0000 mg | ORAL_TABLET | Freq: Two times a day (BID) | ORAL | Status: DC | PRN

## 2023-05-02 MED ORDER — TRAZODONE HCL 50 MG PO TABS
50.0000 mg | ORAL_TABLET | Freq: Every day | ORAL | Status: DC
  Administered 2023-05-02: 50 mg via ORAL
  Filled 2023-05-02: qty 1

## 2023-05-02 MED ORDER — LORAZEPAM 2 MG/ML IJ SOLN: 2.0000 mg | Freq: Two times a day (BID) | INTRAMUSCULAR | Status: DC | PRN

## 2023-05-02 MED ORDER — LORAZEPAM 2 MG/ML IJ SOLN: 2.0000 mg | Freq: Three times a day (TID) | INTRAMUSCULAR | Status: DC | PRN

## 2023-05-02 MED ORDER — HALOPERIDOL 5 MG PO TABS
5.0000 mg | ORAL_TABLET | Freq: Three times a day (TID) | ORAL | Status: DC | PRN
  Administered 2023-05-03: 5 mg via ORAL
  Filled 2023-05-02: qty 1

## 2023-05-02 MED ORDER — DIPHENHYDRAMINE HCL 50 MG/ML IJ SOLN: 50.0000 mg | Freq: Three times a day (TID) | INTRAMUSCULAR | Status: DC | PRN

## 2023-05-02 MED ORDER — HALOPERIDOL LACTATE 5 MG/ML IJ SOLN: 5.0000 mg | Freq: Two times a day (BID) | INTRAMUSCULAR | Status: DC | PRN

## 2023-05-02 MED ORDER — HYDROXYZINE HCL 25 MG PO TABS
25.0000 mg | ORAL_TABLET | Freq: Three times a day (TID) | ORAL | Status: DC | PRN
  Administered 2023-05-03: 25 mg via ORAL
  Filled 2023-05-02: qty 1

## 2023-05-02 MED ORDER — LORAZEPAM 2 MG PO TABS: 2.0000 mg | ORAL_TABLET | Freq: Two times a day (BID) | ORAL | Status: DC | PRN

## 2023-05-02 MED ORDER — HALOPERIDOL LACTATE 5 MG/ML IJ SOLN: 5.0000 mg | Freq: Three times a day (TID) | INTRAMUSCULAR | Status: DC | PRN

## 2023-05-02 MED ORDER — DIPHENHYDRAMINE HCL 50 MG/ML IJ SOLN: 50.0000 mg | Freq: Two times a day (BID) | INTRAMUSCULAR | Status: DC | PRN

## 2023-05-02 MED ORDER — DIPHENHYDRAMINE HCL 25 MG PO CAPS
50.0000 mg | ORAL_CAPSULE | Freq: Three times a day (TID) | ORAL | Status: DC | PRN
  Administered 2023-05-03: 50 mg via ORAL
  Filled 2023-05-02: qty 2

## 2023-05-02 MED ORDER — ACETAMINOPHEN 325 MG PO TABS
650.0000 mg | ORAL_TABLET | Freq: Four times a day (QID) | ORAL | Status: DC | PRN
  Administered 2023-05-03 – 2023-05-04 (×2): 650 mg via ORAL
  Filled 2023-05-02 (×2): qty 2

## 2023-05-02 MED ORDER — TRAZODONE HCL 50 MG PO TABS: 50.0000 mg | ORAL_TABLET | Freq: Every evening | ORAL | Status: DC | PRN

## 2023-05-02 MED ORDER — MAGNESIUM HYDROXIDE 400 MG/5ML PO SUSP
30.0000 mL | Freq: Every day | ORAL | Status: DC | PRN
Start: 2023-05-02 — End: 2023-05-06

## 2023-05-02 MED ORDER — DIPHENHYDRAMINE HCL 25 MG PO CAPS: 50.0000 mg | ORAL_CAPSULE | Freq: Two times a day (BID) | ORAL | Status: DC | PRN

## 2023-05-02 MED ORDER — HALOPERIDOL LACTATE 5 MG/ML IJ SOLN: 2.0000 mg | Freq: Two times a day (BID) | INTRAMUSCULAR | Status: DC | PRN

## 2023-05-02 MED ORDER — HALOPERIDOL LACTATE 5 MG/ML IJ SOLN: 10.0000 mg | Freq: Three times a day (TID) | INTRAMUSCULAR | Status: DC | PRN

## 2023-05-02 MED ORDER — ALUM & MAG HYDROXIDE-SIMETH 200-200-20 MG/5ML PO SUSP: 30.0000 mL | ORAL | Status: DC | PRN

## 2023-05-02 NOTE — Progress Notes (Signed)
   05/02/23 1725  Psych Admission Type (Psych Patients Only)  Admission Status Involuntary  Psychosocial Assessment  Patient Complaints Anxiety;Anger;Substance abuse  Eye Contact Brief  Facial Expression Angry;Anxious;Sad  Affect Anxious;Sad  Speech Logical/coherent  Interaction Assertive  Motor Activity Slow  Appearance/Hygiene In scrubs  Behavior Characteristics Cooperative;Anxious  Mood Depressed;Anxious;Angry;Sad  Thought Process  Coherency WDL  Content WDL  Delusions None reported or observed  Perception WDL  Hallucination None reported or observed  Judgment Impaired  Confusion WDL  Danger to Self  Current suicidal ideation? Denies  Danger to Others  Danger to Others None reported or observed   Patient is A/O x 3 with situation.  Patient appears sad and crying during assessment. Patient states he was "just being funny" when he responded to a message about death from a friend who posted a scull stating he was dead and he posted himself with a towel around his neck with a statement that he was dying too. Denies active suicidal or homicidal ideation. Expresses distress about being separated from his family, particularly his mother and wife. Reports significant anxiety (8/10) and some emotional distress when discussing his situation. No prior psychiatric admissions. No reported history of auditory or visual hallucinations. Denies history of psychosis. Patient has an ankle monitor that needs to be charged as well. Ankle bracelet was beeping on arrival and patient had to call probation officer to alert him. Former smoker, quit in 2017. No current tobacco use. Reports no alcohol use since 2017.

## 2023-05-02 NOTE — BH Assessment (Signed)
 Patient is to be admitted to [facility] on today 05/02/23 by Dr.  Irwin Brakeman .  Attending Physician will be Dr.  Irwin Brakeman .   Patient has been assigned to room 322, by [facility] Charge Nurse Marisue Humble.    ER staff is aware of the admission: Lisa,ER Secretary   Dr. Roxan Hockey, ER MD  Joan Flores Patient's Nurse

## 2023-05-02 NOTE — Consult Note (Signed)
 [facility] Psychiatric Consult Initial  Patient Name: .Joe Keith  MRN: [id number]  DOB: [birth year]  Consult Order details:  Orders (From admission, onward)     Start     Ordered   05/01/23 2317  IP CONSULT TO PSYCHIATRY       Ordering Provider: Pilar Jarvis, MD  Provider:  (Not yet assigned)  Question Answer Comment  Place call to: ED   Reason for Consult Admit      05/01/23 2316   05/01/23 1925  CONSULT TO CALL ACT TEAM       Ordering Provider: Pilar Jarvis, MD  Provider:  (Not yet assigned)  Question:  Reason for Consult?  Answer:  IVC   05/01/23 1925            Mode of Visit: In person   Psychiatry Consult Evaluation  Service Date: May 02, 2023 LOS:  LOS: 0 days  Chief Complaint "somebody said I had a video of me hanging myself"   Primary Psychiatric Diagnoses  Involuntary commitment  Assessment  Joe Keith is a 43 y.o. male admitted to [facility] under IVC petitioned by Patent examiner. Per IVC petition: LAW ENFORCEMENT WAS NOTIFIED BY THE EX SPOUSE OF THE RESPONDENT TO PERFORM A WELL BEING CHECK AFTER RECEIVING A VIDEO OF THE RESPONDENT HANGING HIMSELF. LAW ENFORCEMENT MET WITH RESPONDENT AT THE RESIDENCE AND HE DENIED TAKING THE VIDEO BUT ADMITTED THE VIDEO WAS THE RESPONDENT COMMITTING THE ACT.   Patient was seen yesterday by psychiatry and recommended for re-assessment. On assessment today, patient continues to minimize the video, provides inconsistent narrative about who he sent the video to. He initially was agreeable for his former wife to be called for collateral and provided her phone number, then declined stating his former wife would "lie to keep me here". He was also agreeable for his aunt Joe Keith to be called but I was unable to reach her. Collateral obtained from [facility] police department who state that the video was of patient belly button up, had black rope/cord tied around his neck, then phone appears to drop appearing as if patient had passed out. Will  recommend inpatient psychiatric admission at this time given safety concerns. IVC to remain in place.   Diagnoses:  Active Hospital problems: Principal Problem:   Involuntary commitment   Plan   ## Psychiatric Medication Recommendations:  -Start benadryl 50mg  oral or IM 12 hours PRN agitation -Start haldol 5mg  oral or IM 2 times daily PRN agitation -Start ativan 2mg  oral or IM 2 times daily PRN agitation  ## Medical Decision Making Capacity: Not specifically addressed in this encounter  ## Further Work-up:  -- UDS positive for amphetamines   ## Disposition:-- We recommend inpatient psychiatric hospitalization when medically cleared. Patient is currently under involuntary commitment.   ## Behavioral / Environmental: -Utilize compassion and acknowledge the patient's experiences while setting clear and realistic expectations for care.   ## Safety and Observation Level:  - Based on my clinical evaluation, I estimate the patient to be at LOW risk of self harm in the current setting. - At this time, we recommend  routine safety precautions. This decision is based on my review of the chart including patient's history and current presentation, interview of the patient, mental status examination, and consideration of suicide risk including evaluating suicidal ideation, plan, intent, suicidal or self-harm behaviors, risk factors, and protective factors. This judgment is based on our ability to directly address suicide risk, implement suicide prevention strategies, and develop  a safety plan while the patient is in the clinical setting. Please contact our team if there is a concern that risk level has changed.  CSSR Risk Category:C-SSRS RISK CATEGORY: No Risk  Suicide Risk Assessment: Patient has following risk factors for suicide: poor insight, poor judgment Patient has following non-modifiable or demographic risk factors for suicide: male gender and separation or divorce Patient has the  following protective factors against suicide: Supportive family  Thank you for this consult request. Recommendations have been communicated to the primary team.  We will recommend continuing IVC and recommend inpatient psychiatric admission at this time.   Lauree Chandler, NP       History of Present Illness   Patient Report:  Patient reports he was brought to the emergency department because "somebody said I had a video of me hanging myself". Reports he had recorded the video himself "as a prank" and had sent it to a friend in [place], who is currently hospitalized. Provides inconsistent narrative. He later states he sent it to 2 people, his friend in [place] and his former wife, who he is divorced from. He states in the video he has a phone cord wrapped around his neck and is hanging his head down. He adamantly denies that he is suicidal. He denies homicidal ideations. He denies auditory visual hallucinations or paranoia.   He reports he is living with his aunt, cousin, cousin's daughter. He states his girlfriend stay with them sometimes.   He denies history of non suicidal self injurious behavior, suicide attempt, or inpatient psychiatric hospitalization.  He denies knowledge of family psychiatric history or family history of suicide.  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 He states that he is currently on parole and has an ankle monitor.  Psych ROS:  Denies suicidal ideations Denies homicidal ideations Denies auditory visual hallucinations Denies paranoia  Collateral information:  Pt initially agreeable for his former wife Joe Keith to be called and provided number [phone]. He then declined stating his former wife would "lie to keep me here".   Pt agreeable for his Joe Keith to be called and provided number [phone]. Attempted to call several times without success.    [place] police department: Video is short. Video was of patient belly button up. Patient had black rope/cord tied around his neck. Phone then appears to drop appearing as if patient had passed out.   Review of Systems  Respiratory:  Negative for shortness of breath.   Cardiovascular:  Negative for chest pain.  Psychiatric/Behavioral: Negative.       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 rate 18, height 5\' 10"  (1.778 m), weight 111.1 kg, SpO2 99%. Body mass index is 35.15 kg/m.  Physical Exam Constitutional:      General: He is not in acute distress.    Appearance: He is not ill-appearing, toxic-appearing or diaphoretic.  Pulmonary:     Effort: Pulmonary effort is normal. No respiratory distress.  Neurological:     Mental Status: He is alert and oriented to person, place, and time.  Psychiatric:        Attention and Perception: Attention and perception normal.        Mood  and Affect: Mood normal. Affect is blunt.        Speech: Speech normal.        Behavior: Behavior is combative. Behavior is cooperative.        Thought Content: Thought content normal.        Cognition and Memory: Cognition and memory normal.        Judgment: Judgment is inappropriate.    Mental Status Exam: General Appearance:  Appropriate for environment  Orientation:  Full (Time, Place, and Person)  Memory:  Immediate;   Fair  Concentration:  Concentration: Fair  Recall:  Fair  Attention  Fair  Eye Contact:  Fair  Speech:  Clear and Coherent and Normal Rate  Language:  Fair  Volume:  Normal  Mood: "ok"  Affect:  Blunt  Thought Process:  Coherent, Goal Directed, and Linear  Thought Content:  Logical  Suicidal Thoughts:  No  Homicidal Thoughts:  No  Judgement:  Poor  Insight:   Poor  Psychomotor Activity:  Normal  Akathisia:  No  Fund of Knowledge:  Fair    Assets:  Solicitor Physical Health Resilience Social Support  Cognition:  WNL  ADL's:  Intact  AIMS (if indicated):      Other History   These have been pulled in through the EMR, reviewed, and updated if appropriate.  Family History:  The patient's family history is not on file.  Medical History: History reviewed. No pertinent past medical history.  Surgical History: History reviewed. No pertinent surgical history.  Medications:   Current Facility-Administered Medications:    diphenhydrAMINE (BENADRYL) capsule 50 mg, 50 mg, Oral, Q12H PRN **OR** diphenhydrAMINE (BENADRYL) injection 50 mg, 50 mg, Intramuscular, Q12H PRN, Lauree Chandler, NP   haloperidol (HALDOL) tablet 5 mg, 5 mg, Oral, BID PRN **OR** haloperidol lactate (HALDOL) injection 2 mg, 2 mg, Intramuscular, BID PRN, Lauree Chandler, NP   LORazepam (ATIVAN) tablet 2 mg, 2 mg, Oral, BID PRN **OR** LORazepam (ATIVAN) injection 2 mg, 2 mg, Intramuscular, BID PRN, Lauree Chandler,  NP  Current Outpatient Medications:    acetaminophen (TYLENOL) 500 MG tablet, Take 1,000 mg by mouth every 6 (six) hours as needed for moderate pain., Disp: , Rfl:    Aspirin-Acetaminophen-Caffeine (GOODY HEADACHE PO), Take 1 Package by mouth daily as needed (for headache)., Disp: , Rfl:    cephALEXin (KEFLEX) 500 MG capsule, Take 1 capsule (500 mg total) by mouth 2 (two) times daily., Disp: 40 capsule, Rfl: 0   ibuprofen (ADVIL,MOTRIN) 200 MG tablet, Take 400 mg by mouth every 6 (six) hours as needed for moderate pain., Disp: , Rfl:    oxyCODONE-acetaminophen (PERCOCET) 5-325 MG per tablet, Take 1-2 tablets by mouth every 6 (  six) hours as needed for severe pain., Disp: 20 tablet, Rfl: 0  Allergies: Allergies  Allergen Reactions   Codeine Hives and Nausea Only   Lauree Chandler, NP

## 2023-05-02 NOTE — ED Notes (Signed)
 ivc/reassess in the am Per NP/pending collateral.

## 2023-05-02 NOTE — ED Notes (Signed)
 Pt expressing frustration about being here. States "I spend my time in prison, not in hospitals". Nedra Hai NP messaged to update and request reassessment.

## 2023-05-02 NOTE — ED Notes (Signed)
 Patient transferred to room 21, He was calm and cooperative, said He wanted to watch TV.

## 2023-05-02 NOTE — ED Notes (Signed)
 Pt transferred to [facility] with security and tech. Belongings transferred with patient.

## 2023-05-02 NOTE — BH Assessment (Signed)
 Assessment Note  Joe Keith is an 43 y.o. male presenting to the ED, via [employer], under IVC.  Per the IVC, patient's baby mother saw a video of him with a cord around his neck, believing it was an apparent suicide video.  Police were called for a wellness check in response to the concerns over said video.  Patient reports he saw the video but has no memory of putting the cord around his neck.  He states he received a message from a friend, informing him of their terminal illness.  He states he vaguely recalls sending the friend the video as a joke, pretending he was dead but says he and his friend joke a lot with each other.    Patient denies SI/HI and says if he wanted to kill himself, he would have done so before he got out of prison.  He states he has his children to live for and is trying to turn his life around.  Patient denies drug or alcohol use and reports no past psychiatric history.      Diagnosis: Psychiatric Evaluation for possible suicide attempt.  Past Medical History: History reviewed. No pertinent past medical history.  History reviewed. No pertinent surgical history.  Family History: History reviewed. No pertinent family history.  Social History:  reports that he has been smoking. He does not have any smokeless tobacco history on file. He reports current alcohol use. He reports that he does not use drugs.  Additional Social History:     CIWA: CIWA-Ar BP: (!) 131/93 Pulse Rate: 68 COWS:    Allergies:  Allergies  Allergen Reactions   Codeine Hives and Nausea Only    Home Medications: (Not in a hospital admission)   OB/GYN Status:  No LMP for male patient.  General Assessment Data Location of Assessment: [facility] ED                                         Abuse/Neglect Assessment (Assessment to be complete while patient is alone) Physical Abuse: Denies Verbal Abuse: Denies Sexual Abuse: Denies Exploitation of patient/patient's  resources: Denies Self-Neglect: Denies     Merchant navy officer (For Healthcare) Does Patient Have a Medical Advance Directive?: No Would patient like information on creating a medical advance directive?: No - Patient declined          Disposition: Patient to be reassessed in the morning.  Disposition pending collateral contact.     On Site Evaluation by:   Reviewed with Physician:    Art[place] 05/02/2023 12:11 AM

## 2023-05-02 NOTE — ED Notes (Signed)
 Pt up to nurses desk questioning what is going on and states "yeah I'm not staying here, I gotta go". Updated pt on plan of care to the best of this RN ability. Pt able to be redirected back to stretcher, VS obtained, informed breakfast will be given when delivered.

## 2023-05-02 NOTE — ED Notes (Signed)
 Breakfast tray provided.

## 2023-05-02 NOTE — ED Provider Notes (Incomplete)
 [facility] Provider Note    Event Date/Time   First MD Initiated Contact with Patient 05/01/23 2307     (approximate)   History   Psychiatric Evaluation   HPI  Joe Keith is a 43 y.o. male   Past medical history of no reported past medical history presents emergency department under police IVC for possible suicide attempt.  Per IVC report he had a well check performed on him when his spouse received a video of him with an attempted hanging.  He was brought here by the police.  He states that he saw the video but does not recall performing those actions.  He denies suicidality homicidality or AVH.  He wants to go home.  He denies drug or alcohol use and denies any other acute medical complaints.  He has no hoarse voice, neck pain, vision changes.  External Medical Documents Reviewed: IVC report as above      Physical Exam   Triage Vital Signs: ED Triage Vitals  Encounter Vitals Group     BP 05/01/23 1923 (!) 131/93     Systolic BP Percentile --      Diastolic BP Percentile --      Pulse Rate 05/01/23 1923 68     Resp 05/01/23 1923 16     Temp 05/01/23 1923 97.6 F (36.4 C)     Temp Source 05/01/23 1923 Oral     SpO2 05/01/23 1923 98 %     Weight 05/01/23 1924 245 lb (111.1 kg)     Height 05/01/23 1924 5\' 10"  (1.778 m)     Head Circumference --      Peak Flow --      Pain Score 05/01/23 1923 0     Pain Loc --      Pain Education --      Exclude from Growth Chart --     Most recent vital signs: Vitals:   05/01/23 1923  BP: (!) 131/93  Pulse: 68  Resp: 16  Temp: 97.6 F (36.4 C)  SpO2: 98%    General: Awake, no distress.  CV:  Good peripheral perfusion.  Resp:  Normal effort.  Abd:  No distention.  Other:  Phonation is normal neck supple full range of motion no external signs of trauma.  Lungs clear soft nontender abdomen.   ED Results / Procedures / Treatments   Labs (all labs ordered are listed, but only abnormal  results are displayed) Labs Reviewed  COMPREHENSIVE METABOLIC PANEL - Abnormal; Notable for the following components:      Result Value   Sodium 129 (*)    Chloride 94 (*)    Calcium 8.7 (*)    All other components within normal limits  SALICYLATE LEVEL - Abnormal; Notable for the following components:   Salicylate Lvl <7.0 (*)    All other components within normal limits  ACETAMINOPHEN LEVEL - Abnormal; Notable for the following components:   Acetaminophen (Tylenol), Serum <10 (*)    All other components within normal limits  ETHANOL  CBC  URINE DRUG SCREEN, QUALITATIVE ([facility] ONLY)     I ordered and reviewed the above labs they are notable for hyponatremia 129 otherwise cell counts electrolytes unremarkable.  Salicylate Tylenol levels negative.  EKG  ED ECG REPORT I, Pilar Jarvis, the attending physician, personally viewed and interpreted this ECG.   Date: 05/01/2023  EKG Time: ***  Rate: ***  Rhythm: {ekg findings:315101}  Axis: ***  Intervals:{conduction defects:17367}  ST&T Change: ***    RADIOLOGY I independently reviewed and interpreted *** I also reviewed radiologist's formal read.   PROCEDURES:  Critical Care performed: {CriticalCareYesNo:19197::"Yes, see critical care procedure note(s)","No"}  Procedures   MEDICATIONS ORDERED IN ED: Medications  sodium chloride 0.9 % bolus 1,000 mL (has no administration in time range)    External physician / consultants:  I spoke with *** regarding care plan for this patient.   IMPRESSION / MDM / ASSESSMENT AND PLAN / ED COURSE  I reviewed the triage vital signs and the nursing notes.                                Patient's presentation is most consistent with {EM COPA:27473}  Differential diagnosis includes, but is not limited to, ***   ***The patient is on the cardiac monitor to evaluate for evidence of arrhythmia and/or significant heart rate changes.  MDM:  ***  I considered hospitalization for  admission or observation ***        FINAL CLINICAL IMPRESSION(S) / ED DIAGNOSES   Final diagnoses:  None     Rx / DC Orders   ED Discharge Orders     None        Note:  This document was prepared using Dragon voice recognition software and may include unintentional dictation errors.

## 2023-05-02 NOTE — ED Notes (Signed)
 Patient talking on the phone to His aunt at this time, He is calm and cooperative, will continue to monitor for safety.

## 2023-05-02 NOTE — ED Notes (Signed)
Pt given breakfast tray and beverage.  

## 2023-05-02 NOTE — ED Notes (Signed)
 ivc prior to arrival/psych consult ordered/pending.Marland KitchenMarland Kitchen

## 2023-05-02 NOTE — ED Notes (Signed)
Lunch tray provided for pt

## 2023-05-03 DIAGNOSIS — F331 Major depressive disorder, recurrent, moderate: Secondary | ICD-10-CM | POA: Diagnosis not present

## 2023-05-03 DIAGNOSIS — F411 Generalized anxiety disorder: Secondary | ICD-10-CM | POA: Insufficient documentation

## 2023-05-03 DIAGNOSIS — Z765 Malingerer [conscious simulation]: Secondary | ICD-10-CM

## 2023-05-03 DIAGNOSIS — F1510 Other stimulant abuse, uncomplicated: Secondary | ICD-10-CM | POA: Insufficient documentation

## 2023-05-03 MED ORDER — MIRTAZAPINE 15 MG PO TABS
7.5000 mg | ORAL_TABLET | Freq: Every day | ORAL | Status: DC
  Administered 2023-05-03 – 2023-05-05 (×3): 7.5 mg via ORAL
  Filled 2023-05-03 (×3): qty 1

## 2023-05-03 MED ORDER — VENLAFAXINE HCL ER 37.5 MG PO CP24
37.5000 mg | ORAL_CAPSULE | Freq: Every day | ORAL | Status: DC
  Administered 2023-05-04 – 2023-05-06 (×3): 37.5 mg via ORAL
  Filled 2023-05-03 (×4): qty 1

## 2023-05-03 NOTE — Group Note (Signed)
 Date:  05/03/2023 Time:  10:59 PM  Group Topic/Focus:  Wrap-Up Group:   The focus of this group is to help patients review their daily goal of treatment and discuss progress on daily workbooks.    Participation Level:  Active  Participation Quality:  Appropriate  Affect:  Appropriate  Cognitive:  Appropriate  Insight: Appropriate  Engagement in Group:  Engaged  Modes of Intervention:  Discussion    Lenore Cordia 05/03/2023, 10:59 PM

## 2023-05-03 NOTE — Progress Notes (Signed)
   05/02/23 2000  Psych Admission Type (Psych Patients Only)  Admission Status Involuntary  Psychosocial Assessment  Patient Complaints Anxiety;Substance abuse;Crying spells  Eye Contact Brief  Facial Expression Angry;Anxious  Affect Anxious  Speech Logical/coherent  Interaction Assertive  Motor Activity Slow  Appearance/Hygiene In scrubs  Behavior Characteristics Cooperative;Anxious  Mood Depressed;Anxious;Angry  Aggressive Behavior  Targets Self  Type of Behavior Rage  Effect No apparent injury  Thought Process  Coherency WDL  Content WDL  Delusions None reported or observed;WDL  Perception WDL  Hallucination None reported or observed  Judgment Impaired  Confusion WDL  Danger to Self  Current suicidal ideation? Denies (Denies)  Danger to Others  Danger to Others None reported or observed

## 2023-05-03 NOTE — Progress Notes (Signed)
 Patient pleasant and cooperative. Denies SI, HI, AVH. Medication compliant. Appropriate to staff and peers. No complaints or concerns voiced. Medication compliant.Visible in milieu. Socializing appropriately with staff and peers.  Encouragement and support provided. Safety checks maintained. Medications given as prescribed. Pt receptive and remains safe on unit with q 15 min checks.

## 2023-05-03 NOTE — [facility] Group Notes (Signed)
 [facility] Group Notes:  (Nursing/MHT/Case Management/Adjunct)  Date:  05/03/2023  Time:  2:25 AM  Type of Therapy:  Psychoeducational Skills  Participation Level:  Did Not Attend  Participation Quality:  Resistant  Affect:  Resistant  Cognitive:  Lacking  Insight:  None  Engagement in Group:  None  Modes of Intervention:  Education  Summary of Progress/Problems: The patient did not attend group last evening.   Nelson Noone S 05/03/2023, 2:25 AM
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
## 2023-05-03 NOTE — B[facility] Suicide Risk Assessment (Signed)
 BHH INPATIENT:  Family/Significant Other Suicide Prevention Education  Suicide Prevention Education:  Patient Refusal for Family/Significant Other Suicide Prevention Education: The patient Joe Keith has refused to provide written consent for family/significant other to be provided Family/Significant Other Suicide Prevention Education during admission and/or prior to discharge.  Physician notified.  SPE completed with pt, as pt refused to consent to family contact. SPI pamphlet provided to pt and pt was encouraged to share information with support network, ask questions, and talk about any concerns relating to SPE. Pt denies access to guns/firearms and verbalized understanding of information provided. Mobile Crisis information also provided to pt.  Glenis Smoker 05/03/2023, 3:01 PM

## 2023-05-03 NOTE — Plan of Care (Signed)
   Problem: Education: Goal: Emotional status will improve Outcome: Progressing Goal: Mental status will improve Outcome: Progressing

## 2023-05-03 NOTE — Plan of Care (Signed)
  Problem: Coping: Goal: Ability to demonstrate self-control will improve Outcome: Progressing   Problem: Coping: Goal: Ability to verbalize frustrations and anger appropriately will improve Outcome: Progressing   Problem: Education: Goal: Mental status will improve Outcome: Progressing   Problem: Education: Goal: Emotional status will improve Outcome: Progressing   Problem: Education: Goal: Knowledge of [facility] Education information/materials will improve Outcome: Progressing

## 2023-05-03 NOTE — Group Note (Unsigned)
 Date:  05/03/2023 Time:  10:55 PM  Group Topic/Focus:  Wrap-Up Group:   The focus of this group is to help patients review their daily goal of treatment and discuss progress on daily workbooks.     Participation Level:  {BHH PARTICIPATION ZOXWR:60454}  Participation Quality:  {BHH PARTICIPATION QUALITY:22265}  Affect:  {BHH AFFECT:22266}  Cognitive:  {BHH COGNITIVE:22267}  Insight: {BHH Insight2:20797}  Engagement in Group:  {BHH ENGAGEMENT IN UJWJX:91478}  Modes of Intervention:  {BHH MODES OF INTERVENTION:22269}  Additional Comments:  ***  Lenore Cordia 05/03/2023, 10:55 PM

## 2023-05-03 NOTE — Group Note (Signed)
 Date:  05/03/2023 Time:  10:10 AM  Group Topic/Focus:  Goals Group:   The focus of this group is to help patients establish daily goals to achieve during treatment and discuss how the patient can incorporate goal setting into their daily lives to aide in recovery.    Participation Level:  Did Not Attend   Lynelle Smoke [facility] 05/03/2023, 10:10 AM

## 2023-05-03 NOTE — Progress Notes (Signed)
   05/03/23 1200  Psych Admission Type (Psych Patients Only)  Admission Status Involuntary  Psychosocial Assessment  Patient Complaints Anxiety  Eye Contact Brief  Facial Expression Sad  Affect Appropriate to circumstance  Speech Logical/coherent  Interaction Assertive  Motor Activity Slow  Appearance/Hygiene Unremarkable  Behavior Characteristics Cooperative  Mood Depressed  Thought Process  Coherency WDL  Content WDL  Delusions None reported or observed  Perception WDL  Hallucination None reported or observed  Judgment Impaired  Confusion WDL  Danger to Self  Current suicidal ideation? Denies  Danger to Others  Danger to Others None reported or observed   Patient had PRN x 1 for anxiety. Support and encouragement given.

## 2023-05-03 NOTE — Group Note (Signed)
 Recreation Therapy Group Note   Group Topic:Healthy Support Systems  Group Date: 05/03/2023 Start Time: 1000 End Time: 1055 Facilitators: Rosina Lowenstein, LRT, CTRS Location: Craft Room  Group Description: Straw Bridge. Individually, patients were given 10 plastic drinking straws and an equal length of masking tape. Using the materials provided, patients were instructed to build a free-standing bridge-like structure to suspend an everyday item (ex: puzzle box) off the floor or table surface. All materials were required to be used in Secondary school teacher. LRT facilitated post-activity discussion reviewing how we, humans, are like the structure we built; when things get too heavy in our life and we do not have adequate supports/coping skills, then we will fall just like the straw-built structure will. LRT focused on how having a "base" or structure on the bottom was necessary for the object to stand, meaning we must be secure and stable first before building on ourselves or others. Patients were encouraged to name 2 healthy supports in their life and reflect on how the skills used in this activity can be generalized to daily life post discharge.  Goal Area(s) Addressed:  Patient will identify two healthy support systems in their life. Patient will work on Product manager. Patient will verbalize the importance of having a strong and steady "base".  Patient will follow multi-step directions. Patients will engage in creativity and use all provided materials.   Affect/Mood: N/A   Participation Level: Did not attend    Clinical Observations/Individualized Feedback: Patient did not attend group.   Plan: Continue to engage patient in RT group sessions 2-3x/week.   Rosina Lowenstein, LRT, CTRS 05/03/2023 12:46 PM
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
## 2023-05-03 NOTE — Group Note (Signed)
 [facility] LCSW Group Therapy Note   Group Date: 05/03/2023 Start Time: 1300 End Time: 1400   Type of Therapy and Topic: Group Therapy: Avoiding Self-Sabotaging and Enabling Behaviors  Participation Level: Did Not Attend  Mood:  Description of Group:  In this group, patients will learn how to identify obstacles, self-sabotaging and enabling behaviors, as well as: what are they, why do we do them and what needs these behaviors meet. Discuss unhealthy relationships and how to have positive healthy boundaries with those that sabotage and enable. Explore aspects of self-sabotage and enabling in yourself and how to limit these self-destructive behaviors in everyday life.   Therapeutic Goals: 1. Patient will identify one obstacle that relates to self-sabotage and enabling behaviors 2. Patient will identify one personal self-sabotaging or enabling behavior they did prior to admission 3. Patient will state a plan to change the above identified behavior 4. Patient will demonstrate ability to communicate their needs through discussion and/or role play.    Summary of Patient Progress:   Patient did not attend group.    Therapeutic Modalities:  Cognitive Behavioral Therapy Person-Centered Therapy Motivational Interviewing    Lowry Ram, LCSW

## 2023-05-03 NOTE — Plan of Care (Signed)
  Problem: Education: Goal: Emotional status will improve Outcome: Progressing Goal: Mental status will improve Outcome: Progressing   Problem: Coping: Goal: Ability to verbalize frustrations and anger appropriately will improve Outcome: Progressing   Problem: Safety: Goal: Periods of time without injury will increase Outcome: Progressing

## 2023-05-03 NOTE — H&P (Cosign Needed Addendum)
 Psychiatric Admission Assessment Adult  Patient Identification: Joe Keith MRN:  [id number] Date of Evaluation:  05/03/2023 Chief Complaint:  MDD (major depressive disorder) [F32.9] Principal Diagnosis: Major depressive disorder, recurrent episode, moderate (HCC) Diagnosis:  Principal Problem:   Major depressive disorder, recurrent episode, moderate (HCC) Active Problems:   Malingering   Generalized anxiety disorder   Methamphetamine abuse (HCC)  History of Present Illness:  43 year old Caucasian male with a history of methamphetamine use, previous incarceration, and current parole with an ankle monitor, presenting with symptoms of depression and anxiety. He was brought to the Emergency Department (ED) by law enforcement under an Involuntary Commitment (IVC) order after concerns arose about a video in which he appeared to be attempting to hang himself. The patient minimizes the event, stating, "It was just a prank." He reports that he sent the video to both a friend in [place] (who is currently hospitalized) and his ex-wife. He describes the video as him posing with a phone cord around his neck but insists his feet were on the ground and denies suicidal intent.The patient initially denied any history of psychiatric treatment, but later disclosed that while incarcerated, he was prescribed Lexapro, Zoloft, and Abilify. However, he states that he does not want to take any medications and just wants to go home. Upon further discussion, he admits to experiencing ongoing symptoms of depression and anxiety, prompting a request for medication. He describes feeling "stressed out," "restless," and "having a hard time sleeping." He denies current suicidal ideation (SI), homicidal ideation (HI), or psychotic symptoms (AVH, paranoia).He reports living with his aunt, cousin, and cousin's daughter and that his girlfriend occasionally stays with them. 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 He denies current substance use but acknowledges a past history of methamphetamine possession, for which he was incarcerated and released in July.Per chart review collateral information was attempted but was unsuccessful in reaching his Joe Keith. He initially agreed to allow contact with his ex-wife Joe Keith but later declined, stating, "She would lie to keep me here."Given his inconsistent narrative, minimization of concerning behavior, and current request for medication, further evaluation is needed to determine the appropriate course of treatment. Associated Signs/Symptoms: Depression Symptoms:  depressed mood, insomnia, suicidal attempt, anxiety, (Hypo) Manic Symptoms:  Impulsivity, Anxiety Symptoms:  Excessive Worry, Social Anxiety, Psychotic Symptoms:   none reported PTSD Symptoms: Negative Total Time spent with patient: 2.5 hours  Past Psychiatric History: Depression Substance Abuse  Is the patient at risk to self? Yes.    Has the patient been a risk to self in the past 6 months? No.  Has the patient been a risk to self within the distant past? No.  Is the patient a risk to others? No.  Has the patient been a risk to others in the past 6 months? No.  Has the patient been a risk to others within the distant past? No.   [place] Scale:  Flowsheet Row Admission (Current) from 05/02/2023 in [facility] INPATIENT BE[facility] MEDICINE ED from 05/01/2023 in [facility] Department at [facility]  C-SSRS RISK CATEGORY No Risk No Risk        Prior Inpatient Therapy: No. If yes, describe  other than Corrections medicine Prior Outpatient Therapy: No. If yes, describe none   Alcohol Screening: 1. How often do you have a drink containing alcohol?: Never 2. How many drinks containing alcohol do you have on a typical day when you are drinking?: 1 or 2 3. How  often do you have six or more drinks on one occasion?: Never AUDIT-C Score: 0 4. How often during the last year have  you found that you were not able to stop drinking once you had started?: Never 5. How often during the last year have you failed to do what was normally expected from you because of drinking?: Never 6. How often during the last year have you needed a first drink in the morning to get yourself going after a heavy drinking session?: Never 7. How often during the last year have you had a feeling of guilt of remorse after drinking?: Never 8. How often during the last year have you been unable to remember what happened the night before because you had been drinking?: Never 9. Have you or someone else been injured as a result of your drinking?: No 10. Has a relative or friend or a doctor or another health worker been concerned about your drinking or suggested you cut down?: No Alcohol Use Disorder Identification Test Final Score (AUDIT): 0 Alcohol Brief Interventions/Follow-up: Alcohol education/Brief advice Substance Abuse History in the last 12 months:  Yes.   Consequences of Substance Abuse: Negative Previous Psychotropic Medications: Yes  Psychological Evaluations: No  Past Medical History: History reviewed. No pertinent past medical history. History reviewed. No pertinent surgical history. Family History: History reviewed. No pertinent family history. Family Psychiatric  History: nine reported Tobacco Screening:  Social History   Tobacco Use  Smoking Status Every Day  Smokeless Tobacco Not on file    BH Tobacco Counseling     Are you interested in Tobacco Cessation Medications?  No value filed. Counseled patient on smoking cessation:  No value filed. Reason Tobacco Screening Not Completed: No value filed.       Social History:  Social History   Substance and Sexual Activity  Alcohol Use Yes   Comment: occ     Social History   Substance and Sexual Activity  Drug Use No    Additional Social History: Marital status: Single Are you sexually active?: Yes Does patient have  children?: Yes How many children?: 3                         Allergies:   Allergies  Allergen Reactions   Codeine Hives and Nausea Only   Lab Results:  Results for orders placed or performed during the hospital encounter of 05/01/23 (from the past 48 hours)  Comprehensive metabolic panel     Status: Abnormal   Collection Time: 05/01/23  7:28 PM  Result Value Ref Range   Sodium 129 (L) 135 - 145 mmol/L   Potassium 3.7 3.5 - 5.1 mmol/L   Chloride 94 (L) 98 - 111 mmol/L   CO2 27 22 - 32 mmol/L   Glucose, Bld 96 70 - 99 mg/dL    Comment: Glucose reference range applies only to samples taken after fasting for at least 8 hours.   BUN 15 6 - 20 mg/dL   Creatinine, Ser 1.61 0.61 - 1.24 mg/dL   Calcium 8.7 (L) 8.9 - 10.3 mg/dL   Total Protein 7.0 6.5 - 8.1 g/dL   Albumin 4.0 3.5 - 5.0 g/dL   AST 22 15 - 41 U/L   ALT 26 0 - 44 U/L   Alkaline Phosphatase 53 38 - 126 U/L   Total Bilirubin 1.2 0.0 - 1.2 mg/dL   GFR, Estimated >09 >60 mL/min    Comment: (NOTE) Calculated using the  CKD-EPI Creatinine Equation (2021)    Anion gap 8 5 - 15    Comment: Performed at [facility], [address] [place], [place] [address]  Ethanol     Status: None   Collection Time: 05/01/23  7:28 PM  Result Value Ref Range   Alcohol, Ethyl (B) <10 <10 mg/dL    Comment: (NOTE) Lowest detectable limit for serum alcohol is 10 mg/dL.  For medical purposes only. Performed at [facility], [address] [place], [place] [address]   Salicylate level     Status: Abnormal   Collection Time: 05/01/23  7:28 PM  Result Value Ref Range   Salicylate Lvl <7.0 (L) 7.0 - 30.0 mg/dL    Comment: Performed at [facility], [address] [place], [place] [address]  Acetaminophen level     Status: Abnormal   Collection Time: 05/01/23  7:28 PM  Result Value Ref Range   Acetaminophen (Tylenol), Serum <10 (L) 10 - 30 ug/mL    Comment: (NOTE) Therapeutic concentrations vary  significantly. A range of 10-30 ug/mL  may be an effective concentration for many patients. However, some  are best treated at concentrations outside of this range. Acetaminophen concentrations >150 ug/mL at 4 hours after ingestion  and >50 ug/mL at 12 hours after ingestion are often associated with  toxic reactions.  Performed at [facility], [address] [place], [place] [address]   cbc     Status: None   Collection Time: 05/01/23  7:28 PM  Result Value Ref Range   WBC 5.7 4.0 - 10.5 K/uL   RBC 5.19 4.22 - 5.81 MIL/uL   Hemoglobin 14.4 13.0 - 17.0 g/dL   HCT 65.7 84.6 - 96.2 %   MCV 84.4 80.0 - 100.0 fL   MCH 27.7 26.0 - 34.0 pg   MCHC 32.9 30.0 - 36.0 g/dL   RDW 95.2 84.1 - 32.4 %   Platelets 205 150 - 400 K/uL   nRBC 0.0 0.0 - 0.2 %    Comment: Performed at [facility], [address] [place], [place] [address]  Comprehensive metabolic panel     Status: Abnormal   Collection Time: 05/02/23  3:04 PM  Result Value Ref Range   Sodium 135 135 - 145 mmol/L   Potassium 4.0 3.5 - 5.1 mmol/L   Chloride 103 98 - 111 mmol/L   CO2 27 22 - 32 mmol/L   Glucose, Bld 125 (H) 70 - 99 mg/dL    Comment: Glucose reference range applies only to samples taken after fasting for at least 8 hours.   BUN 10 6 - 20 mg/dL   Creatinine, Ser 7.25 0.61 - 1.24 mg/dL   Calcium 8.5 (L) 8.9 - 10.3 mg/dL   Total Protein 6.3 (L) 6.5 - 8.1 g/dL   Albumin 3.6 3.5 - 5.0 g/dL   AST 19 15 - 41 U/L   ALT 23 0 - 44 U/L   Alkaline Phosphatase 49 38 - 126 U/L   Total Bilirubin 1.5 (H) 0.0 - 1.2 mg/dL   GFR, Estimated >36 >64 mL/min    Comment: (NOTE) Calculated using the CKD-EPI Creatinine Equation (2021)    Anion gap 6 5 - 15    Comment: Performed at [facility], [address], [place], [place] [address]    Blood Alcohol level:  Lab Results  Component Value Date   [facility] <10 05/01/2023    Metabolic Disorder Labs:  No results found for: "HGBA1C", "  MPG" No results  found for: "PROLACTIN" No results found for: "CHOL", "TRIG", "HDL", "CHOLHDL", "VLDL", "LDLCALC"  Current Medications: Current Facility-Administered Medications  Medication Dose Route Frequency Provider Last Rate Last Admin   acetaminophen (TYLENOL) tablet 650 mg  650 mg Oral Q6H PRN Lauree Chandler, NP   650 mg at 05/03/23 0849   alum & mag hydroxide-simeth (MAALOX/MYLANTA) 200-200-20 MG/5ML suspension 30 mL  30 mL Oral Q4H PRN Lauree Chandler, NP       haloperidol (HALDOL) tablet 5 mg  5 mg Oral TID PRN Lauree Chandler, NP       And   diphenhydrAMINE (BENADRYL) capsule 50 mg  50 mg Oral TID PRN Lauree Chandler, NP       haloperidol lactate (HALDOL) injection 5 mg  5 mg Intramuscular TID PRN Lauree Chandler, NP       And   diphenhydrAMINE (BENADRYL) injection 50 mg  50 mg Intramuscular TID PRN Lauree Chandler, NP       And   LORazepam (ATIVAN) injection 2 mg  2 mg Intramuscular TID PRN Lauree Chandler, NP       haloperidol lactate (HALDOL) injection 10 mg  10 mg Intramuscular TID PRN Lauree Chandler, NP       And   diphenhydrAMINE (BENADRYL) injection 50 mg  50 mg Intramuscular TID PRN Lauree Chandler, NP       And   LORazepam (ATIVAN) injection 2 mg  2 mg Intramuscular TID PRN Lauree Chandler, NP       hydrOXYzine (ATARAX) tablet 25 mg  25 mg Oral TID PRN Lauree Chandler, NP   25 mg at 05/03/23 1719   magnesium hydroxide (MILK OF MAGNESIA) suspension 30 mL  30 mL Oral Daily PRN Lauree Chandler, NP       mirtazapine (REMERON) tablet 7.5 mg  7.5 mg Oral QHS [name], NP       [START ON 05/04/2023] venlafaxine XR (EFFEXOR-XR) 24 hr capsule 37.5 mg  37.5 mg Oral Q breakfast [name], NP       PTA Medications: Medications Prior to Admission  Medication Sig Dispense Refill Last Dose/Taking   acetaminophen (TYLENOL) 500 MG tablet Take 1,000 mg by mouth every 6 (six) hours as needed for moderate pain.      Aspirin-Acetaminophen-Caffeine  (GOODY HEADACHE PO) Take 1 Package by mouth daily as needed (for headache).      cephALEXin (KEFLEX) 500 MG capsule Take 1 capsule (500 mg total) by mouth 2 (two) times daily. 40 capsule 0    ibuprofen (ADVIL,MOTRIN) 200 MG tablet Take 400 mg by mouth every 6 (six) hours as needed for moderate pain.      oxyCODONE-acetaminophen (PERCOCET) 5-325 MG per tablet Take 1-2 tablets by mouth every 6 (six) hours as needed for severe pain. 20 tablet 0     Musculoskeletal: Strength & Muscle Tone: within normal limits Gait & Station: normal Patient leans: N/A            Psychiatric Specialty Exam:  Presentation  General Appearance:  Fairly Groomed (Guarded, minimizing behavior, cooperative but evasive regarding details of the video)  Eye Contact: Good (appropriate for stated age)  Speech: Normal Rate (but defensive when discussing hospitalization)  Speech Volume: Normal  Handedness: Right   Mood and Affect  Mood: Irritable (Neutral with occasional irritability)  Affect: Blunt (minimizes the gravity of the situation)   Thought Process  Thought Processes: Linear  Duration of Psychotic Symptoms: 90 days Past Diagnosis of Schizophrenia or Psychoactive disorder: No  Descriptions of Associations:Circumstantial  Orientation:Full (Time, Place and Person)  Thought Content:WDL (but minimizes self-harm behavior)  Hallucinations:Hallucinations: None  Ideas of Reference:None  Suicidal Thoughts:Suicidal Thoughts: No  Homicidal Thoughts:Homicidal Thoughts: No   Sensorium  Memory: Immediate Good; Recent Good; Remote Good  Judgment: Impaired (into the seriousness of his actions)  Insight: Lacking   Executive Functions  Concentration: Fair  Attention Span: Fair  Recall: Good  Fund of Knowledge: Good  Language: Good   Psychomotor Activity  Psychomotor Activity: Psychomotor Activity: Normal   Assets  Assets: Communication Skills;  Housing   Sleep  Sleep: Sleep: Good Number of Hours of Sleep: 6    Physical Exam: Physical Exam Vitals and nursing note reviewed.  Constitutional:      Appearance: Normal appearance.  HENT:     Head: Normocephalic and atraumatic.     Nose: Nose normal.  Pulmonary:     Effort: Pulmonary effort is normal.  Musculoskeletal:        General: Normal range of motion.     Cervical back: Normal range of motion.     Comments: Right ankle monitor  Neurological:     General: No focal deficit present.     Mental Status: Mental status is at baseline.  Psychiatric:        Attention and Perception: Attention and perception normal.        Mood and Affect: Mood is anxious. Affect is flat.        Speech: Speech normal.        Behavior: Behavior is withdrawn. Behavior is cooperative.        Thought Content: Thought content normal.        Cognition and Memory: Cognition and memory normal.        Judgment: Judgment is impulsive.    Review of Systems  Psychiatric/Behavioral:  Positive for depression and substance abuse. The patient is nervous/anxious.   All other systems reviewed and are negative.  Blood pressure 127/74, pulse (!) 58, temperature (!) 97.3 F (36.3 C), resp. rate 16, height 5\' 10"  (1.778 m), weight 104.8 kg, SpO2 98%. Body mass index is 33.15 kg/m.  Treatment Plan Summary: Daily contact with patient to assess and evaluate symptoms and progress in treatment and Medication management Venlafaxine (Effexor) 37.5 mg daily, Start at a low dose for depression and anxiety, with plans to titrate as needed based on response and tolerability. Mirtazapine (Remeron) 7.5 mg nightly - Initiated for sleep support, appetite stimulation, and mood stabilization. Hydroxyzine 25 mg PRN for anxiety - Non-habit-forming alternative to benzodiazepines for situational anxiety. Q15-minute safety checks to monitor for worsening mood, suicidal ideation (SI), behavioral changes, or medication side  effects. Assess sleep patterns, appetite, and mood stabilization over the next 24-48 hours. Encourage engagement in psychoeducational groups and coping skills training. Assess for underlying trauma, personality pathology, or mood dysregulation Observation Level/Precautions:  Continuous Observation 15 minute checks  Laboratory:  Folic Acid HbAIC Vitamin B-12 lipids  Psychotherapy:    Medications:  Effexor Remeron  Consultations:    Discharge Concerns:    Estimated LOS:  Other:     Physician Treatment Plan for Primary Diagnosis: Major depressive disorder, recurrent episode, moderate (HCC) Long Term Goal(s): Improvement in symptoms so as ready for discharge  Short Term Goals: Ability to identify changes in lifestyle to reduce recurrence of condition will improve, Ability to verbalize feelings will improve, Ability to disclose and discuss  suicidal ideas, Ability to demonstrate self-control will improve, Ability to identify and develop effective coping behaviors will improve, Ability to maintain clinical measurements within normal limits will improve, Compliance with prescribed medications will improve, and Ability to identify triggers associated with substance abuse/mental health issues will improve  Physician Treatment Plan for Secondary Diagnosis: Principal Problem:   Major depressive disorder, recurrent episode, moderate (HCC) Active Problems:   Malingering   Generalized anxiety disorder   Methamphetamine abuse (HCC)  Long Term Goal(s): Improvement in symptoms so as ready for discharge  Short Term Goals: Ability to identify changes in lifestyle to reduce recurrence of condition will improve, Ability to verbalize feelings will improve, Ability to disclose and discuss suicidal ideas, Ability to demonstrate self-control will improve, Ability to identify and develop effective coping behaviors will improve, Ability to maintain clinical measurements within normal limits will improve,  Compliance with prescribed medications will improve, and Ability to identify triggers associated with substance abuse/mental health issues will improve  I certify that inpatient services furnished can reasonably be expected to improve the patient's condition.    [name], NP 3/18/20255:37 PM

## 2023-05-04 DIAGNOSIS — F331 Major depressive disorder, recurrent, moderate: Secondary | ICD-10-CM | POA: Diagnosis not present

## 2023-05-04 MED ORDER — MELATONIN 5 MG PO TABS
5.0000 mg | ORAL_TABLET | Freq: Every day | ORAL | Status: DC
Start: 2023-05-04 — End: 2023-05-06
  Administered 2023-05-04 – 2023-05-05 (×2): 5 mg via ORAL
  Filled 2023-05-04 (×2): qty 1

## 2023-05-04 NOTE — Progress Notes (Signed)
 [facility] MD Progress Note  05/04/2023 8:29 PM Joe Keith  MRN:  [id number] Subjective:   43 year old Caucasian male (IVC),s experiencing significant sleep disturbances and expresses a desire for discharge but refuses to involve support systems necessary for safe discharge planning. This refusal indicates limited insight and impaired judgment, presenting challenges to ensuring safety post-discharge. Principal Problem: Major depressive disorder, recurrent episode, moderate (HCC) Diagnosis: Principal Problem:   Major depressive disorder, recurrent episode, moderate (HCC) Active Problems:   Malingering   Generalized anxiety disorder   Methamphetamine abuse (HCC)  Total Time spent with patient: 1 hour  Past Psychiatric History: Depression, Substanace Abise  Past Medical History: History reviewed. No pertinent past medical history. History reviewed. No pertinent surgical history. Family History: History reviewed. No pertinent family history. Family Psychiatric  History: none reported Social History:  Social History   Substance and Sexual Activity  Alcohol Use Yes   Comment: occ     Social History   Substance and Sexual Activity  Drug Use No    Social History   Socioeconomic History   Marital status: Single    Spouse name: Not on file   Number of children: Not on file   Years of education: Not on file   Highest education level: Not on file  Occupational History   Not on file  Tobacco Use   Smoking status: Every Day   Smokeless tobacco: Not on file  Substance and Sexual Activity   Alcohol use: Yes    Comment: occ   Drug use: No   Sexual activity: Not on file  Other Topics Concern   Not on file  Social History Narrative   Not on file   Social Drivers of Health   Financial Resource Strain: Not on file  Food Insecurity: Food Insecurity Present (05/02/2023)   Hunger Vital Sign    Worried About Running Out of Food in the Last Year: Sometimes true    Ran Out of Food in  the Last Year: Sometimes true  Transportation Needs: Unmet Transportation Needs (05/02/2023)   PRAPARE - Administrator, Civil Service (Medical): Yes    Lack of Transportation (Non-Medical): Yes  Physical Activity: Not on file  Stress: Not on file  Social Connections: Moderately Isolated (05/02/2023)   Social Connection and Isolation Panel [NHANES]    Frequency of Communication with Friends and Family: More than three times a week    Frequency of Social Gatherings with Friends and Family: More than three times a week    Attends Religious Services: Never    Database administrator or Organizations: No    Attends Engineer, structural: Patient declined    Marital Status: Living with partner   Additional Social History:                         Sleep: Fair  Appetite:  Good  Current Medications: Current Facility-Administered Medications  Medication Dose Route Frequency Provider Last Rate Last Admin   acetaminophen (TYLENOL) tablet 650 mg  650 mg Oral Q6H PRN Lauree Chandler, NP   650 mg at 05/04/23 0936   alum & mag hydroxide-simeth (MAALOX/MYLANTA) 200-200-20 MG/5ML suspension 30 mL  30 mL Oral Q4H PRN Lauree Chandler, NP       haloperidol (HALDOL) tablet 5 mg  5 mg Oral TID PRN Lauree Chandler, NP   5 mg at 05/03/23 2053   And   diphenhydrAMINE (BENADRYL) capsule 50 mg  50 mg Oral TID PRN Lauree Chandler, NP   50 mg at 05/03/23 2053   haloperidol lactate (HALDOL) injection 5 mg  5 mg Intramuscular TID PRN Lauree Chandler, NP       And   diphenhydrAMINE (BENADRYL) injection 50 mg  50 mg Intramuscular TID PRN Lauree Chandler, NP       And   LORazepam (ATIVAN) injection 2 mg  2 mg Intramuscular TID PRN Lauree Chandler, NP       haloperidol lactate (HALDOL) injection 10 mg  10 mg Intramuscular TID PRN Lauree Chandler, NP       And   diphenhydrAMINE (BENADRYL) injection 50 mg  50 mg Intramuscular TID PRN Lauree Chandler, NP        And   LORazepam (ATIVAN) injection 2 mg  2 mg Intramuscular TID PRN Lauree Chandler, NP       hydrOXYzine (ATARAX) tablet 25 mg  25 mg Oral TID PRN Lauree Chandler, NP   25 mg at 05/03/23 1719   magnesium hydroxide (MILK OF MAGNESIA) suspension 30 mL  30 mL Oral Daily PRN Lauree Chandler, NP       melatonin tablet 5 mg  5 mg Oral QHS [name], NP       mirtazapine (REMERON) tablet 7.5 mg  7.5 mg Oral QHS [name], NP   7.5 mg at 05/03/23 2054   venlafaxine XR (EFFEXOR-XR) 24 hr capsule 37.5 mg  37.5 mg Oral Q breakfast [name], NP   37.5 mg at 05/04/23 6387    Lab Results:  No results found for this or any previous visit (from the past 48 hours).   Blood Alcohol level:  Lab Results  Component Value Date   ETH <10 05/01/2023    Metabolic Disorder Labs: No results found for: "HGBA1C", "MPG" No results found for: "PROLACTIN" No results found for: "CHOL", "TRIG", "HDL", "CHOLHDL", "VLDL", "LDLCALC"  Physical Findings: AIMS:  , ,  ,  ,    CIWA:    COWS:     Musculoskeletal: Strength & Muscle Tone: within normal limits Gait & Station: normal Patient leans: N/A  Psychiatric Specialty Exam:  Presentation  General Appearance:  Fairly Groomed  Eye Contact: Minimal  Speech: Clear and Coherent; Normal Rate  Speech Volume: Normal  Handedness: Right   Mood and Affect  Mood: Anxious; Irritable  Affect: Flat   Thought Process  Thought Processes: Coherent  Descriptions of Associations:Intact  Orientation:Full (Time, Place and Person)  Thought Content:WDL  History of Schizophrenia/Schizoaffective disorder:No  Duration of Psychotic Symptoms:-- (none at this time)  Hallucinations:Hallucinations: None  Ideas of Reference:None  Suicidal Thoughts:Suicidal Thoughts: No  Homicidal Thoughts:Homicidal Thoughts: No   Sensorium  Memory: Immediate Good; Recent Good; Remote Good  Judgment: Fair  Insight: Fair   Restaurant manager, fast food  Concentration: Fair  Attention Span: Fair  Recall: Fair  Fund of Knowledge: Good  Language: Good   Psychomotor Activity  Psychomotor Activity: Psychomotor Activity: Normal   Assets  Assets: Social Support; Manufacturing systems engineer; Physical Health   Sleep  Sleep: Sleep: Poor Number of Hours of Sleep: 2    Physical Exam: Physical Exam Vitals and nursing note reviewed.  Constitutional:      Appearance: Normal appearance.  HENT:     Head: Normocephalic and atraumatic.     Nose: Nose normal.  Pulmonary:     Effort: Pulmonary effort is normal.  Musculoskeletal:  General: Normal range of motion.     Cervical back: Normal range of motion.  Neurological:     General: No focal deficit present.     Mental Status: He is alert. Mental status is at baseline.  Psychiatric:        Attention and Perception: Attention and perception normal.        Mood and Affect: Mood is anxious. Affect is flat.        Speech: Speech normal.        Behavior: Behavior normal. Behavior is cooperative.        Thought Content: Thought content normal.        Cognition and Memory: Cognition and memory normal.        Judgment: Judgment is impulsive.    ROS Blood pressure (!) 141/84, pulse 81, temperature (!) 97.5 F (36.4 C), resp. rate 18, height 5\' 10"  (1.778 m), weight 104.8 kg, SpO2 100%. Body mass index is 33.15 kg/m.   Treatment Plan Summary: Daily contact with patient to assess and evaluate symptoms and progress in treatment and Medication management Venlafaxine (Effexor) 37.5 mg daily, Start at a low dose for depression and anxiety, with plans to titrate as needed based on response and tolerability. Mirtazapine (Remeron) 7.5 mg nightly - Initiated for sleep support, appetite stimulation, and mood stabilization. Hydroxyzine 25 mg PRN for anxiety - Non-habit-forming alternative to benzodiazepines for situational anxiety. Q15-minute safety checks to monitor for  worsening mood, suicidal ideation (SI), behavioral changes, or medication side effects. Assess sleep patterns, appetite, and mood stabilization over the next 24-48 hours. Encourage engagement in psychoeducational groups and coping skills training. Assess for underlying trauma, personality pathology, or mood dysregulation [name], NP 05/04/2023, 8:29 PM

## 2023-05-04 NOTE — [facility] Suicide Risk Assessment (Signed)
 [facility] INPATIENT:  Family/Significant Other Suicide Prevention Education  Suicide Prevention Education:  Contact Attempts: Corrie Dandy Villatoro/aunt [phone]), has been identified by the patient as the family member/significant other with whom the patient will be residing, and identified as the person(s) who will aid the patient in the event of a mental health crisis.  With written consent from the patient, two attempts were made to provide suicide prevention education, prior to and/or following the patient's discharge.  We were unsuccessful in providing suicide prevention education.  A suicide education pamphlet was given to the patient to share with family/significant other.  Date and time of first attempt: 05/04/23 at 14:45 Date and time of second attempt: Second attempt needed.   CSW was unable to establish contact but HIPAA compliant voicemail left with contact information for follow through.   Glenis Smoker 05/04/2023, 2:47 PM

## 2023-05-04 NOTE — Plan of Care (Signed)
   Problem: Education: Goal: Emotional status will improve Outcome: Progressing Goal: Mental status will improve Outcome: Progressing Goal: Verbalization of understanding the information provided will improve Outcome: Progressing

## 2023-05-04 NOTE — Group Note (Signed)
 Date:  05/04/2023 Time:  5:08 PM  Group Topic/Focus:  Activity Group: The focus of the group is to promote activity for the patients and encourage them to go outside to the courtyard for some fresh air and exercise.    Participation Level:  Did Not Attend   Joe Keith 05/04/2023, 5:08 PM

## 2023-05-04 NOTE — BH IP Treatment Plan (Signed)
 Interdisciplinary Treatment and Diagnostic Plan Update  05/04/2023 Time of Session: 10:34 AM Joe Keith MRN: [id number]  Principal Diagnosis: Major depressive disorder, recurrent episode, moderate (HCC)  Secondary Diagnoses: Principal Problem:   Major depressive disorder, recurrent episode, moderate (HCC) Active Problems:   Malingering   Generalized anxiety disorder   Methamphetamine abuse (HCC)   Current Medications:  Current Facility-Administered Medications  Medication Dose Route Frequency Provider Last Rate Last Admin   acetaminophen (TYLENOL) tablet 650 mg  650 mg Oral Q6H PRN Lauree Chandler, NP   650 mg at 05/04/23 0936   alum & mag hydroxide-simeth (MAALOX/MYLANTA) 200-200-20 MG/5ML suspension 30 mL  30 mL Oral Q4H PRN Lauree Chandler, NP       haloperidol (HALDOL) tablet 5 mg  5 mg Oral TID PRN Lauree Chandler, NP   5 mg at 05/03/23 2053   And   diphenhydrAMINE (BENADRYL) capsule 50 mg  50 mg Oral TID PRN Lauree Chandler, NP   50 mg at 05/03/23 2053   haloperidol lactate (HALDOL) injection 5 mg  5 mg Intramuscular TID PRN Lauree Chandler, NP       And   diphenhydrAMINE (BENADRYL) injection 50 mg  50 mg Intramuscular TID PRN Lauree Chandler, NP       And   LORazepam (ATIVAN) injection 2 mg  2 mg Intramuscular TID PRN Lauree Chandler, NP       haloperidol lactate (HALDOL) injection 10 mg  10 mg Intramuscular TID PRN Lauree Chandler, NP       And   diphenhydrAMINE (BENADRYL) injection 50 mg  50 mg Intramuscular TID PRN Lauree Chandler, NP       And   LORazepam (ATIVAN) injection 2 mg  2 mg Intramuscular TID PRN Lauree Chandler, NP       hydrOXYzine (ATARAX) tablet 25 mg  25 mg Oral TID PRN Lauree Chandler, NP   25 mg at 05/03/23 1719   magnesium hydroxide (MILK OF MAGNESIA) suspension 30 mL  30 mL Oral Daily PRN Lauree Chandler, NP       mirtazapine (REMERON) tablet 7.5 mg  7.5 mg Oral QHS [name], NP   7.5 mg at  05/03/23 2054   venlafaxine XR (EFFEXOR-XR) 24 hr capsule 37.5 mg  37.5 mg Oral Q breakfast [name], NP   37.5 mg at 05/04/23 0865   PTA Medications: Medications Prior to Admission  Medication Sig Dispense Refill Last Dose/Taking   acetaminophen (TYLENOL) 500 MG tablet Take 1,000 mg by mouth every 6 (six) hours as needed for moderate pain.      Aspirin-Acetaminophen-Caffeine (GOODY HEADACHE PO) Take 1 Package by mouth daily as needed (for headache).      cephALEXin (KEFLEX) 500 MG capsule Take 1 capsule (500 mg total) by mouth 2 (two) times daily. 40 capsule 0    ibuprofen (ADVIL,MOTRIN) 200 MG tablet Take 400 mg by mouth every 6 (six) hours as needed for moderate pain.      oxyCODONE-acetaminophen (PERCOCET) 5-325 MG per tablet Take 1-2 tablets by mouth every 6 (six) hours as needed for severe pain. 20 tablet 0     Patient Stressors:    Patient Strengths:    Treatment Modalities: Medication Management, Group therapy, Case management,  1 to 1 session with clinician, Psychoeducation, Recreational therapy.   Physician Treatment Plan for Primary Diagnosis: Major depressive disorder, recurrent episode, moderate (HCC) Long Term Goal(s): Improvement in symptoms so as  ready for discharge   Short Term Goals: Ability to identify changes in lifestyle to reduce recurrence of condition will improve Ability to verbalize feelings will improve Ability to disclose and discuss suicidal ideas Ability to demonstrate self-control will improve Ability to identify and develop effective coping behaviors will improve Ability to maintain clinical measurements within normal limits will improve Compliance with prescribed medications will improve Ability to identify triggers associated with substance abuse/mental health issues will improve  Medication Management: Evaluate patient's response, side effects, and tolerance of medication regimen.  Therapeutic Interventions: 1 to 1 sessions, Unit Group  sessions and Medication administration.  Evaluation of Outcomes: Not Met  Physician Treatment Plan for Secondary Diagnosis: Principal Problem:   Major depressive disorder, recurrent episode, moderate (HCC) Active Problems:   Malingering   Generalized anxiety disorder   Methamphetamine abuse (HCC)  Long Term Goal(s): Improvement in symptoms so as ready for discharge   Short Term Goals: Ability to identify changes in lifestyle to reduce recurrence of condition will improve Ability to verbalize feelings will improve Ability to disclose and discuss suicidal ideas Ability to demonstrate self-control will improve Ability to identify and develop effective coping behaviors will improve Ability to maintain clinical measurements within normal limits will improve Compliance with prescribed medications will improve Ability to identify triggers associated with substance abuse/mental health issues will improve     Medication Management: Evaluate patient's response, side effects, and tolerance of medication regimen.  Therapeutic Interventions: 1 to 1 sessions, Unit Group sessions and Medication administration.  Evaluation of Outcomes: Not Met   RN Treatment Plan for Primary Diagnosis: Major depressive disorder, recurrent episode, moderate (HCC) Long Term Goal(s): Knowledge of disease and therapeutic regimen to maintain health will improve  Short Term Goals: Ability to verbalize frustration and anger appropriately will improve, Ability to demonstrate self-control, Ability to participate in decision making will improve, Ability to verbalize feelings will improve, Ability to disclose and discuss suicidal ideas, Ability to identify and develop effective coping behaviors will improve, and Compliance with prescribed medications will improve  Medication Management: RN will administer medications as ordered by provider, will assess and evaluate patient's response and provide education to patient for  prescribed medication. RN will report any adverse and/or side effects to prescribing provider.  Therapeutic Interventions: 1 on 1 counseling sessions, Psychoeducation, Medication administration, Evaluate responses to treatment, Monitor vital signs and CBGs as ordered, Perform/monitor CIWA, COWS, AIMS and Fall Risk screenings as ordered, Perform wound care treatments as ordered.  Evaluation of Outcomes: Not Met   LCSW Treatment Plan for Primary Diagnosis: Major depressive disorder, recurrent episode, moderate (HCC) Long Term Goal(s): Safe transition to appropriate next level of care at discharge, Engage patient in therapeutic group addressing interpersonal concerns.  Short Term Goals: Engage patient in aftercare planning with referrals and resources, Increase social support, Increase ability to appropriately verbalize feelings, Increase emotional regulation, Facilitate acceptance of mental health diagnosis and concerns, Facilitate patient progression through stages of change regarding substance use diagnoses and concerns, Identify triggers associated with mental health/substance abuse issues, and Increase skills for wellness and recovery  Therapeutic Interventions: Assess for all discharge needs, 1 to 1 time with Social worker, Explore available resources and support systems, Assess for adequacy in community support network, Educate family and significant other(s) on suicide prevention, Complete Psychosocial Assessment, Interpersonal group therapy.  Evaluation of Outcomes: Not Met   Progress in Treatment: Attending groups: Yes. and No. Participating in groups: Yes. and No. Taking medication as prescribed: Yes. Toleration medication: Yes.  Family/Significant other contact made: No, will contact:  Patient Refusal for Family/Significant Other Suicide Prevention Education: The patient JEPTHA HINNENKAMP has refused to provide written consent for family/significant other to be provided Family/Significant  Other Suicide Prevention Education during admission and/or prior to discharge.  Physician notified.  Patient understands diagnosis: Yes. Discussing patient identified problems/goals with staff: Yes. Medical problems stabilized or resolved: Yes. Denies suicidal/homicidal ideation: Yes. Issues/concerns per patient self-inventory: No. Other: None  New problem(s) identified: No, Describe:  None  New Short Term/Long Term Goal(s): detox, elimination of symptoms of psychosis, medication management for mood stabilization; elimination of SI thoughts; development of comprehensive mental wellness/sobriety plan.    Patient Goals:  "Stay safe and stay positive."  Discharge Plan or Barriers: CSW to assist in the development of appropriate discharge plan.   Reason for Continuation of Hospitalization: Anxiety Depression Medical Issues Medication stabilization Suicidal ideation  Estimated Length of Stay: 1-7 days.  Last 3 [place] Suicide Severity Risk Score: Flowsheet Row Admission (Current) from 05/02/2023 in [facility] ED from 05/01/2023 in [facility] at [facility]  C-SSRS RISK CATEGORY No Risk No Risk       Last PHQ 2/9 Scores:     No data to display          Scribe for Treatment Team: Lowry Ram, LCSW 05/04/2023 10:41 AM

## 2023-05-04 NOTE — Group Note (Signed)
 Date:  05/04/2023 Time:  9:48 PM  Group Topic/Focus:  Personal Choices and Values:   The focus of this group is to help patients assess and explore the importance of values in their lives, how their values affect their decisions, how they express their values and what opposes their expression.    Participation Level:  Active  Participation Quality:  Appropriate  Affect:  Appropriate  Cognitive:  Appropriate  Insight: Appropriate  Engagement in Group:  Supportive  Modes of Intervention:  Support  Additional Comments:     Belva Crome 05/04/2023, 9:48 PM

## 2023-05-04 NOTE — Group Note (Signed)
 [facility] LCSW Group Therapy Note   Group Date: 05/04/2023 Start Time: 1300 End Time: 1400   Type of Therapy/Topic:  Group Therapy:  Emotion Regulation  Participation Level:  Did Not Attend   Mood:  Description of Group:    The purpose of this group is to assist patients in learning to regulate negative emotions and experience positive emotions. Patients will be guided to discuss ways in which they have been vulnerable to their negative emotions. These vulnerabilities will be juxtaposed with experiences of positive emotions or situations, and patients challenged to use positive emotions to combat negative ones. Special emphasis will be placed on coping with negative emotions in conflict situations, and patients will process healthy conflict resolution skills.  Therapeutic Goals: Patient will identify two positive emotions or experiences to reflect on in order to balance out negative emotions:  Patient will label two or more emotions that they find the most difficult to experience:  Patient will be able to demonstrate positive conflict resolution skills through discussion or role plays:   Summary of Patient Progress:   Patient declined to attend group.     Therapeutic Modalities:   Cognitive Behavioral Therapy Feelings Identification Dialectical Behavioral Therapy   Harden Mo, LCSW

## 2023-05-04 NOTE — Progress Notes (Signed)
 Patient had visitor this shift. Voiced no concerns or complaints. Denies SI, HI, AVH. Medication compliant. Charged ankle bracelet this shift. Visible in milieu. Minimal interaction with peers. Did watch tv and go to group. Encouragement and support provided. Safety checks maintained. Medications given as prescribed. Pt receptive and remains safe on unit with q 15 min checks.

## 2023-05-04 NOTE — Progress Notes (Signed)
   05/04/23 0936  Psych Admission Type (Psych Patients Only)  Admission Status Involuntary  Psychosocial Assessment  Patient Complaints Anxiety  Eye Contact Fair  Facial Expression Flat  Affect Irritable  Speech Logical/coherent  Interaction Minimal  Motor Activity Other (Comment) (WDL)  Appearance/Hygiene Unremarkable  Behavior Characteristics Cooperative;Irritable  Mood Depressed;Irritable  Thought Process  Coherency WDL  Content WDL  Delusions None reported or observed  Perception WDL  Hallucination None reported or observed  Judgment Impaired  Confusion None  Danger to Self  Current suicidal ideation? Denies  Agreement Not to Harm Self Yes  Description of Agreement Verbal  Danger to Others  Danger to Others None reported or observed

## 2023-05-05 DIAGNOSIS — F331 Major depressive disorder, recurrent, moderate: Secondary | ICD-10-CM | POA: Diagnosis not present

## 2023-05-05 MED ORDER — MIRTAZAPINE 7.5 MG PO TABS
7.5000 mg | ORAL_TABLET | Freq: Every day | ORAL | 0 refills | Status: DC
  Filled 2023-05-05: qty 30, 30d supply, fill #0

## 2023-05-05 MED ORDER — HYDROXYZINE HCL 25 MG PO TABS
25.0000 mg | ORAL_TABLET | Freq: Three times a day (TID) | ORAL | 0 refills | Status: AC | PRN
  Filled 2023-05-05: qty 30, 10d supply, fill #0

## 2023-05-05 MED ORDER — VENLAFAXINE HCL ER 37.5 MG PO CP24
37.5000 mg | ORAL_CAPSULE | Freq: Every day | ORAL | 0 refills | Status: DC
  Filled 2023-05-05: qty 30, 30d supply, fill #0

## 2023-05-05 NOTE — Progress Notes (Incomplete)
 [facility] MD Progress Note  05/05/2023 11:00 AM Joe Keith  MRN:  [id number] Subjective:  43 year old Caucasian male who is currently preparing for discharge from inpatient psychiatric care. During today's interaction, the patient stated, "I am ready to go home," indicating his belief that he has reached a level of stability. the patient presented with depressed mood, irritability, and anxiety. He was isolative but cooperative with staff. His affect remained flat, and motor activity was slow. While his appearance and hygiene were unremarkable, his overall interaction remained limited. Principal Problem: Major depressive disorder, recurrent episode, moderate (HCC) Diagnosis: Principal Problem:   Major depressive disorder, recurrent episode, moderate (HCC) Active Problems:   Malingering   Generalized anxiety disorder   Methamphetamine abuse (HCC)  Total Time spent with patient: 1 hour  Past Psychiatric History:Depression Anxiety  Past Medical History: History reviewed. No pertinent past medical history. History reviewed. No pertinent surgical history. Family History: History reviewed. No pertinent family history. Family Psychiatric  History: none reported Social History:  Social History   Substance and Sexual Activity  Alcohol Use Yes   Comment: occ     Social History   Substance and Sexual Activity  Drug Use No    Social History   Socioeconomic History   Marital status: Single    Spouse name: Not on file   Number of children: Not on file   Years of education: Not on file   Highest education level: Not on file  Occupational History   Not on file  Tobacco Use   Smoking status: Every Day   Smokeless tobacco: Not on file  Substance and Sexual Activity   Alcohol use: Yes    Comment: occ   Drug use: No   Sexual activity: Not on file  Other Topics Concern   Not on file  Social History Narrative   Not on file   Social Drivers of Health   Financial Resource Strain: Not on  file  Food Insecurity: Food Insecurity Present (05/02/2023)   Hunger Vital Sign    Worried About Running Out of Food in the Last Year: Sometimes true    Ran Out of Food in the Last Year: Sometimes true  Transportation Needs: Unmet Transportation Needs (05/02/2023)   PRAPARE - Administrator, Civil Service (Medical): Yes    Lack of Transportation (Non-Medical): Yes  Physical Activity: Not on file  Stress: Not on file  Social Connections: Moderately Isolated (05/02/2023)   Social Connection and Isolation Panel [NHANES]    Frequency of Communication with Friends and Family: More than three times a week    Frequency of Social Gatherings with Friends and Family: More than three times a week    Attends Religious Services: Never    Database administrator or Organizations: No    Attends Engineer, structural: Patient declined    Marital Status: Living with partner   Additional Social History:                         Sleep: Good  Appetite:  Good  Current Medications: Current Facility-Administered Medications  Medication Dose Route Frequency Provider Last Rate Last Admin   acetaminophen (TYLENOL) tablet 650 mg  650 mg Oral Q6H PRN Lauree Chandler, NP   650 mg at 05/04/23 0936   alum & mag hydroxide-simeth (MAALOX/MYLANTA) 200-200-20 MG/5ML suspension 30 mL  30 mL Oral Q4H PRN Lauree Chandler, NP       haloperidol (  HALDOL) tablet 5 mg  5 mg Oral TID PRN Lauree Chandler, NP   5 mg at 05/03/23 2053   And   diphenhydrAMINE (BENADRYL) capsule 50 mg  50 mg Oral TID PRN Lauree Chandler, NP   50 mg at 05/03/23 2053   haloperidol lactate (HALDOL) injection 5 mg  5 mg Intramuscular TID PRN Lauree Chandler, NP       And   diphenhydrAMINE (BENADRYL) injection 50 mg  50 mg Intramuscular TID PRN Lauree Chandler, NP       And   LORazepam (ATIVAN) injection 2 mg  2 mg Intramuscular TID PRN Lauree Chandler, NP       haloperidol lactate (HALDOL)  injection 10 mg  10 mg Intramuscular TID PRN Lauree Chandler, NP       And   diphenhydrAMINE (BENADRYL) injection 50 mg  50 mg Intramuscular TID PRN Lauree Chandler, NP       And   LORazepam (ATIVAN) injection 2 mg  2 mg Intramuscular TID PRN Lauree Chandler, NP       hydrOXYzine (ATARAX) tablet 25 mg  25 mg Oral TID PRN Lauree Chandler, NP   25 mg at 05/03/23 1719   magnesium hydroxide (MILK OF MAGNESIA) suspension 30 mL  30 mL Oral Daily PRN Lauree Chandler, NP       melatonin tablet 5 mg  5 mg Oral QHS [name], NP   5 mg at 05/04/23 2052   mirtazapine (REMERON) tablet 7.5 mg  7.5 mg Oral QHS [name], NP   7.5 mg at 05/04/23 2053   venlafaxine XR (EFFEXOR-XR) 24 hr capsule 37.5 mg  37.5 mg Oral Q breakfast [name], NP   37.5 mg at 05/05/23 3762    Lab Results: No results found for this or any previous visit (from the past 48 hours).  Blood Alcohol level:  Lab Results  Component Value Date   ETH <10 05/01/2023    Metabolic Disorder Labs: No results found for: "HGBA1C", "MPG" No results found for: "PROLACTIN" No results found for: "CHOL", "TRIG", "HDL", "CHOLHDL", "VLDL", "LDLCALC"  Physical Findings: AIMS:  , ,  ,  ,    CIWA:    COWS:     Musculoskeletal: Strength & Muscle Tone: within normal limits Gait & Station: normal Patient leans: N/A  Psychiatric Specialty Exam:  Presentation  General Appearance:  Fairly Groomed  Eye Contact: Minimal  Speech: Clear and Coherent; Normal Rate  Speech Volume: Normal  Handedness: Right   Mood and Affect  Mood: Anxious; Irritable  Affect: Flat   Thought Process  Thought Processes: Coherent  Descriptions of Associations:Intact  Orientation:Full (Time, Place and Person)  Thought Content:WDL  History of Schizophrenia/Schizoaffective disorder:No  Duration of Psychotic Symptoms:-- (none at this time)  Hallucinations:Hallucinations: None  Ideas of  Reference:None  Suicidal Thoughts:Suicidal Thoughts: No  Homicidal Thoughts:Homicidal Thoughts: No   Sensorium  Memory: Immediate Good; Recent Good; Remote Good  Judgment: Fair  Insight: Fair   Art therapist  Concentration: Fair  Attention Span: Fair  Recall: Fair  Fund of Knowledge: Good  Language: Good   Psychomotor Activity  Psychomotor Activity: Psychomotor Activity: Normal   Assets  Assets: Social Support; Manufacturing systems engineer; Physical Health   Sleep  Sleep: Sleep: Poor Number of Hours of Sleep: 2    Physical Exam: Physical Exam Vitals and nursing note reviewed.  Constitutional:      Appearance: Normal appearance.  HENT:     Head: Normocephalic and atraumatic.     Nose: Nose normal.  Pulmonary:     Effort: Pulmonary effort is normal.  Musculoskeletal:        General: Normal range of motion.     Cervical back: Normal range of motion.  Neurological:     General: No focal deficit present.     Mental Status: He is alert and oriented to person, place, and time. Mental status is at baseline.  Psychiatric:        Attention and Perception: Attention and perception normal.        Mood and Affect: Mood is anxious. Affect is flat.        Speech: Speech normal.        Behavior: Behavior normal. Behavior is cooperative.        Thought Content: Thought content normal.        Cognition and Memory: Cognition and memory normal.        Judgment: Judgment is impulsive.    ROS Blood pressure 111/76, pulse (!) 51, temperature 98.4 F (36.9 C), resp. rate 16, height 5\' 10"  (1.778 m), weight 104.8 kg, SpO2 98%. Body mass index is 33.15 kg/m.   Treatment Plan Summary: Daily contact with patient to assess and evaluate symptoms and progress in treatment and Medication management  Venlafaxine (Effexor) 37.5 mg daily, Start at a low dose for depression and anxiety, with plans to titrate as needed based on response and tolerability. Mirtazapine  (Remeron) 7.5 mg nightly - Initiated for sleep support, appetite stimulation, and mood stabilization. Hydroxyzine 25 mg PRN for anxiety - Non-habit-forming alternative to benzodiazepines for situational anxiety. Q15-minute safety checks to monitor for worsening mood, suicidal ideation (SI), behavioral changes, or medication side effects. Assess sleep patterns, appetite, and mood stabilization over the next 24-48 hours. Encourage engagement in psychoeducational groups and coping skills training. Assess for underlying trauma, personality pathology, or mood dysregulation [name], NP 05/05/2023, 11:00 AM

## 2023-05-05 NOTE — Group Note (Signed)
 Date:  05/05/2023 Time:  8:55 PM  Group Topic/Focus:  Wrap-Up Group:   The focus of this group is to help patients review their daily goal of treatment and discuss progress on daily workbooks.    Participation Level:  Active  Participation Quality:  Appropriate  Affect:  Appropriate  Cognitive:  Appropriate  Insight: Appropriate  Engagement in Group:  Engaged  Modes of Intervention:  Discussion  Additional Comments:    Jerauld Bostwick Luretha Rued 05/05/2023, 8:55 PM

## 2023-05-05 NOTE — Plan of Care (Signed)
  Problem: Activity: Goal: Interest or engagement in activities will improve Outcome: Progressing Goal: Sleeping patterns will improve Outcome: Progressing   Problem: Education: Goal: Knowledge of [place] [facility] Education information/materials will improve Outcome: Progressing Goal: Emotional status will improve Outcome: Progressing

## 2023-05-05 NOTE — Group Note (Signed)
 Recreation Therapy Group Note   Group Topic:Goal Setting  Group Date: 05/05/2023 Start Time: 1000 End Time: 1055 Facilitators: Rosina Lowenstein, LRT, CTRS Location: Craft Room  Group Description: Product/process development scientist. Patients were given many different magazines, a glue stick, markers, and a piece of cardstock paper. LRT and pts discussed the importance of having goals in life. LRT and pts discussed the difference between short-term and long-term goals, as well as what a SMART goal is. LRT encouraged pts to create a vision board, with images they picked and then cut out with safety scissors from the magazine, for themselves, that capture their short and long-term goals. LRT encouraged pts to show and explain their vision board to the group.   Goal Area(s) Addressed:  Patient will gain knowledge of short vs. long term goals.  Patient will identify goals for themselves. Patient will practice setting SMART goals. Patient will verbalize their goals to LRT and peers.  Affect/Mood: N/A   Participation Level: Did not attend    Clinical Observations/Individualized Feedback: Patient did not attend group.   Plan: Continue to engage patient in RT group sessions 2-3x/week.   Rosina Lowenstein, LRT, CTRS 05/05/2023 12:59 PM

## 2023-05-05 NOTE — Progress Notes (Signed)
   05/05/23 1800  Psych Admission Type (Psych Patients Only)  Admission Status Involuntary  Psychosocial Assessment  Patient Complaints Anxiety  Eye Contact Fair  Facial Expression Flat  Affect Irritable  Speech Logical/coherent  Interaction Isolative  Motor Activity Slow  Appearance/Hygiene Unremarkable  Behavior Characteristics Cooperative  Mood Depressed  Thought Process  Coherency WDL  Content WDL  Delusions None reported or observed  Perception WDL  Hallucination None reported or observed  Judgment Impaired  Confusion None  Danger to Self  Current suicidal ideation? Denies  Agreement Not to Harm Self Yes  Description of Agreement verbal  Danger to Others  Danger to Others None reported or observed

## 2023-05-05 NOTE — Plan of Care (Signed)
  Problem: Education: Goal: Knowledge of [place] [facility] Education information/materials will improve Outcome: Progressing   Problem: Activity: Goal: Interest or engagement in activities will improve Outcome: Not Met (add Reason)   Problem: Coping: Goal: Ability to verbalize frustrations and anger appropriately will improve Outcome: Not Met (add Reason)   Problem: Safety: Goal: Periods of time without injury will increase Outcome: Progressing

## 2023-05-05 NOTE — [facility] Suicide Risk Assessment (Addendum)
 [facility] INPATIENT:  Family/Significant Other Suicide Prevention Education  Suicide Prevention Education:  Education Completed; Corrie Dandy [place]/aunt [phone]), has been identified by the patient as the family member/significant other with whom the patient will be residing, and identified as the person(s) who will aid the patient in the event of a mental health crisis (suicidal ideations/suicide attempt).  With written consent from the patient, the family member/significant other has been provided the following suicide prevention education, prior to the and/or following the discharge of the patient.  The suicide prevention education provided includes the following: Suicide risk factors Suicide prevention and interventions National Suicide Hotline telephone number [facility] assessment telephone number [facility] Emergency Assistance 911 [facility] and/or Residential Mobile Crisis Unit telephone number  Request made of family/significant other to: Remove weapons (e.g., guns, rifles, knives), all items previously/currently identified as safety concern.   Remove drugs/medications (over-the-counter, prescriptions, illicit drugs), all items previously/currently identified as a safety concern.  The family member/significant other verbalizes understanding of the suicide prevention education information provided.  The family member/significant other agrees to remove the items of safety concern listed above.  Gassett stated that pt is in the hospital because of something stupid. She went on to share that  someone called saying that he was trying to kill himself. She stated that pt is just funny and is always trying to make people laugh. She feeling that pt is a danger to himself or anyone else. Alfieri denied pt having access to any weapons.   Glenis Smoker 05/05/2023, 10:55 AM

## 2023-05-05 NOTE — Plan of Care (Signed)
   Problem: Education: Goal: Knowledge of [place] [facility] Education information/materials will improve Outcome: Progressing Goal: Emotional status will improve Outcome: Progressing Goal: Mental status will improve Outcome: Progressing

## 2023-05-05 NOTE — Group Note (Signed)
 [facility] LCSW Group Therapy Note   Group Date: 05/05/2023 Start Time: 1300 End Time: 1340   Type of Therapy/Topic:  Group Therapy:  Balance in Life  Participation Level:  Did Not Attend   Description of Group:    This group will address the concept of balance and how it feels and looks when one is unbalanced. Patients will be encouraged to process areas in their lives that are out of balance, and identify reasons for remaining unbalanced. Facilitators will guide patients utilizing problem- solving interventions to address and correct the stressor making their life unbalanced. Understanding and applying boundaries will be explored and addressed for obtaining  and maintaining a balanced life. Patients will be encouraged to explore ways to assertively make their unbalanced needs known to significant others in their lives, using other group members and facilitator for support and feedback.  Therapeutic Goals: Patient will identify two or more emotions or situations they have that consume much of in their lives. Patient will identify signs/triggers that life has become out of balance:  Patient will identify two ways to set boundaries in order to achieve balance in their lives:  Patient will demonstrate ability to communicate their needs through discussion and/or role plays  Summary of Patient Progress: Patient did not attend.     Therapeutic Modalities:   Cognitive Behavioral Therapy Solution-Focused Therapy Assertiveness Training   Glenis Smoker, LCSW

## 2023-05-05 NOTE — Group Note (Signed)
 Date:  05/05/2023 Time:  9:59 AM  Group Topic/Focus:  Rediscovering Joy:   The focus of this group is to explore various ways to relieve stress in a positive manner.    Participation Level:  Did Not Attend   Mary Sella Kamaree Wheatley 05/05/2023, 9:59 AM

## 2023-05-06 ENCOUNTER — Other Ambulatory Visit: Payer: Self-pay

## 2023-05-06 DIAGNOSIS — F331 Major depressive disorder, recurrent, moderate: Secondary | ICD-10-CM

## 2023-05-06 NOTE — Progress Notes (Signed)
 Discharge Note:  Patient denies SI/HI/AVH at this time. Discharge instructions, AVS, prescriptions, and transition record gone over with patient. Patient agrees to comply with medication management, follow-up visit, and outpatient therapy. Patient belongings returned to patient. Patient questions and concerns addressed and answered. Patient discharging to home with fiance. [facility]

## 2023-05-06 NOTE — Progress Notes (Signed)
  [facility] Adult Case Management Discharge Plan :  Will you be returning to the same living situation after discharge:  Yes,  pt plans to return home upon discharge.  At discharge, do you have transportation home?: Yes,  support system to provide transportation.  Do you have the ability to pay for your medications: Yes,  [place] MEDICAID PREPAID HEALTH PLAN / [place] MEDICAID [employer] [place]  Release of information consent forms completed and in the chart;  Patient's signature needed at discharge.  Patient to Follow up at:  Follow-up Information     [facility] . Go to.   Why: In person appointment is 05/11/23 at 11 AM. Contact information: [address] [place] [address] [phone]                 Next level of care provider has access to [facility] Link:no  Safety Planning and Suicide Prevention discussed: Yes,  SPE completed with Augustin Schooling, aunt.      Has patient been referred to the Quitline?: Patient refused referral for treatment  Patient has been referred for addiction treatment: Yes, the patient will follow up with an outpatient provider for substance use disorder. Therapist: appointment made  Glenis Smoker, LCSW 05/06/2023, 9:18 AM

## 2023-05-06 NOTE — Plan of Care (Signed)
  Problem: Education: Goal: Knowledge of [place] [facility] Education information/materials will improve 05/06/2023 0959 by Earleen Newport, RN Outcome: Progressing 05/06/2023 0959 by Earleen Newport, RN Outcome: Progressing Goal: Emotional status will improve 05/06/2023 0959 by Earleen Newport, RN Outcome: Progressing 05/06/2023 0959 by Earleen Newport, RN Outcome: Progressing Goal: Mental status will improve 05/06/2023 0959 by Earleen Newport, RN Outcome: Progressing 05/06/2023 0959 by Earleen Newport, RN Outcome: Progressing Goal: Verbalization of understanding the information provided will improve 05/06/2023 0959 by Earleen Newport, RN Outcome: Progressing 05/06/2023 0959 by Earleen Newport, RN Outcome: Progressing   Problem: Activity: Goal: Interest or engagement in activities will improve 05/06/2023 0959 by Earleen Newport, RN Outcome: Progressing 05/06/2023 0959 by Earleen Newport, RN Outcome: Progressing Goal: Sleeping patterns will improve 05/06/2023 0959 by Earleen Newport, RN Outcome: Progressing 05/06/2023 0959 by Earleen Newport, RN Outcome: Progressing   Problem: Coping: Goal: Ability to verbalize frustrations and anger appropriately will improve 05/06/2023 0959 by Earleen Newport, RN Outcome: Progressing 05/06/2023 0959 by Earleen Newport, RN Outcome: Progressing Goal: Ability to demonstrate self-control will improve 05/06/2023 0959 by Earleen Newport, RN Outcome: Progressing 05/06/2023 0959 by Earleen Newport, RN Outcome: Progressing   Problem: Health Behavior/Discharge Planning: Goal: Identification of resources available to assist in meeting health care needs will improve 05/06/2023 0959 by Earleen Newport, RN Outcome: Progressing 05/06/2023 0959 by Earleen Newport, RN Outcome: Progressing Goal: Compliance with treatment plan for underlying cause of condition will improve 05/06/2023 0959 by Earleen Newport, RN Outcome: Progressing 05/06/2023  0959 by Earleen Newport, RN Outcome: Progressing   Problem: Physical Regulation: Goal: Ability to maintain clinical measurements within normal limits will improve 05/06/2023 0959 by Earleen Newport, RN Outcome: Progressing 05/06/2023 0959 by Earleen Newport, RN Outcome: Progressing   Problem: Safety: Goal: Periods of time without injury will increase 05/06/2023 0959 by Earleen Newport, RN Outcome: Progressing 05/06/2023 0959 by Earleen Newport, RN Outcome: Progressing

## 2023-05-06 NOTE — Progress Notes (Signed)
   05/06/23 1000  Psych Admission Type (Psych Patients Only)  Admission Status Voluntary  Psychosocial Assessment  Patient Complaints None  Eye Contact Fair  Facial Expression Flat  Affect Appropriate to circumstance  Speech Logical/coherent  Interaction Isolative  Motor Activity Slow  Appearance/Hygiene Unremarkable  Behavior Characteristics Cooperative  Mood Pleasant (goal to stay positive)  Thought Process  Coherency WDL  Content WDL  Delusions None reported or observed  Perception WDL  Hallucination None reported or observed  Judgment WDL  Confusion None  Danger to Self  Current suicidal ideation? Denies  Agreement Not to Harm Self Yes  Description of Agreement verbal  Danger to Others  Danger to Others None reported or observed

## 2023-05-06 NOTE — Group Note (Signed)
 Date:  05/06/2023 Time:  10:26 AM  Group Topic/Focus:  Spirituality:   The focus of this group is to discuss how one's spirituality can aide in recovery.    Participation Level:  Did Not Attend   Joe Keith Joe Keith 05/06/2023, 10:26 AM

## 2023-05-06 NOTE — Progress Notes (Signed)
   05/06/23 0500  Psych Admission Type (Psych Patients Only)  Admission Status Involuntary  Psychosocial Assessment  Patient Complaints Anxiety  Eye Contact Fair  Facial Expression Flat  Affect Irritable  Speech Logical/coherent  Interaction Isolative  Motor Activity Slow  Appearance/Hygiene Unremarkable  Behavior Characteristics Cooperative  Mood Depressed  Aggressive Behavior  Effect No apparent injury  Thought Process  Coherency WDL  Content WDL  Delusions None reported or observed  Perception WDL  Hallucination None reported or observed  Judgment Impaired  Confusion None  Danger to Self  Description of Agreement verbal  Danger to Others  Danger to Others None reported or observed

## 2023-05-06 NOTE — [facility] Suicide Risk Assessment (Signed)
 [facility] Discharge Suicide Risk Assessment   Principal Problem: Major depressive disorder, recurrent episode, moderate (HCC) Discharge Diagnoses: Principal Problem:   Major depressive disorder, recurrent episode, moderate (HCC) Active Problems:   Malingering   Generalized anxiety disorder   Methamphetamine abuse (HCC)   Total Time spent with patient: 1.5 hours  Musculoskeletal: Strength & Muscle Tone: within normal limits Gait & Station: normal Patient leans: N/A  Psychiatric Specialty Exam  Presentation  General Appearance:  Fairly Groomed  Eye Contact: Minimal  Speech: Clear and Coherent; Normal Rate  Speech Volume: Normal  Handedness: Right   Mood and Affect  Mood: Anxious  Duration of Depression Symptoms: No data recorded Affect: Flat   Thought Process  Thought Processes: Coherent  Descriptions of Associations:Intact  Orientation:Full (Time, Place and Person)  Thought Content:Logical  History of Schizophrenia/Schizoaffective disorder:No  Duration of Psychotic Symptoms:-- (none reported)  Hallucinations:Hallucinations: None  Ideas of Reference:None  Suicidal Thoughts:Suicidal Thoughts: No  Homicidal Thoughts:Homicidal Thoughts: No   Sensorium  Memory: Immediate Good; Recent Good; Remote Good  Judgment: Good  Insight: Good   Executive Functions  Concentration: Good  Attention Span: Good  Recall: Good  Fund of Knowledge: Good  Language: Good   Psychomotor Activity  Psychomotor Activity: Psychomotor Activity: Normal   Assets  Assets: Communication Skills; Social Support   Sleep  Sleep: Sleep: Good Number of Hours of Sleep: 7   Physical Exam: Physical Exam Vitals and nursing note reviewed.  Constitutional:      Appearance: Normal appearance.  HENT:     Head: Normocephalic and atraumatic.     Nose: Nose normal.  Pulmonary:     Effort: Pulmonary effort is normal.  Musculoskeletal:        General:  Normal range of motion.     Cervical back: Normal range of motion.  Neurological:     General: No focal deficit present.     Mental Status: He is alert and oriented to person, place, and time. Mental status is at baseline.  Psychiatric:        Attention and Perception: Attention and perception normal.        Mood and Affect: Mood normal. Affect is flat.        Speech: Speech normal.        Behavior: Behavior normal. Behavior is cooperative.        Thought Content: Thought content normal.        Cognition and Memory: Cognition and memory normal.        Judgment: Judgment is impulsive.    Review of Systems  All other systems reviewed and are negative.  Blood pressure 111/81, pulse (!) 56, temperature 98 F (36.7 C), resp. rate 14, height 5\' 10"  (1.778 m), weight 104.8 kg, SpO2 98%. Body mass index is 33.15 kg/m.  Mental Status Per Nursing Assessment::   On Admission:  NA  Demographic Factors:  Age 15 or older, Caucasian, and Low socioeconomic status Divorced  Loss Factors: Legal issues  Historical Factors: Prior suicide attempts, Family history of mental illness or substance abuse, and Impulsivity  Risk Reduction Factors:   Sense of responsibility to family, Employed, Living with another person, especially a relative, Positive social support, and Positive therapeutic relationship  Continued Clinical Symptoms:  Depression:   Comorbid alcohol abuse/dependence Impulsivity Alcohol/Substance Abuse/Dependencies  Cognitive Features That Contribute To Risk:  None    Suicide Risk:  Minimal: No identifiable suicidal ideation.  Patients presenting with no risk factors but with morbid ruminations; may be  classified as minimal risk based on the severity of the depressive symptoms   Follow-up Information     [facility], [facility]. Go to.   Why: In person appointment is 05/11/23 at 11 AM. Contact information: [address] [place] [address] [phone]                  Plan Of Care/Follow-up recommendations:  Activity:  as tolerated Diet:  heart healthy Venlafaxine (Effexor) 37.5 mg daily - for depression and anxiety; start low, monitor for side effects Mirtazapine (Remeron) 7.5 mg nightly - for insomnia, appetite stimulation, and mood support Hydroxyzine 25 mg PO as needed - for situational anxiety; non-addictive, benzodiazepine alternative National Suicide & Crisis Lifeline: ?? 988 or 1-800-273-TALK (1-5595926954) - Free, confidential support. Crisis Text Line: ?? Text HELLO to 605-525-5447 - Connect with a trained crisis counseling Follow up with [facility] within 7 days of discharge [name], NP 05/06/2023, 8:51 AM
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
## 2023-05-12 ENCOUNTER — Encounter: Payer: Self-pay | Admitting: Emergency Medicine

## 2023-05-12 ENCOUNTER — Emergency Department

## 2023-05-12 ENCOUNTER — Other Ambulatory Visit: Payer: Self-pay

## 2023-05-12 ENCOUNTER — Emergency Department
Admission: EM | Admit: 2023-05-12 | Discharge: 2023-05-12 | Disposition: A | Discharge status: Home / Self Care | Attending: Emergency Medicine | Admitting: Emergency Medicine

## 2023-05-12 DIAGNOSIS — M5441 Lumbago with sciatica, right side: Secondary | ICD-10-CM | POA: Insufficient documentation

## 2023-05-12 DIAGNOSIS — W19XXXA Unspecified fall, initial encounter: Secondary | ICD-10-CM

## 2023-05-12 DIAGNOSIS — W11XXXA Fall on and from ladder, initial encounter: Secondary | ICD-10-CM | POA: Insufficient documentation

## 2023-05-12 DIAGNOSIS — M5440 Lumbago with sciatica, unspecified side: Secondary | ICD-10-CM

## 2023-05-12 DIAGNOSIS — M545 Low back pain, unspecified: Secondary | ICD-10-CM | POA: Diagnosis present

## 2023-05-12 MED ORDER — NAPROXEN 500 MG PO TABS: 500.0000 mg | ORAL_TABLET | Freq: Two times a day (BID) | ORAL | 0 refills | Status: DC

## 2023-05-12 MED ORDER — IBUPROFEN 600 MG PO TABS
600.0000 mg | ORAL_TABLET | Freq: Once | ORAL | Status: AC
  Administered 2023-05-12: 600 mg via ORAL
  Filled 2023-05-12: qty 1

## 2023-05-12 NOTE — Discharge Instructions (Addendum)
 You have been diagnosed with lower back pain.  Please take naproxen 1 tablet by mouth every 12  hours as needed for pain.  Please come back to ED or go to your PCP if you have new symptoms or symptoms worsen.

## 2023-05-12 NOTE — ED Provider Notes (Signed)
 [facility] Provider Note    Event Date/Time   First MD Initiated Contact with Patient 05/12/23 2049     (approximate)   History   Fall    HPI  Joe Keith is a 43 y.o. male   with a past medical history of depression and anxiety, presents to the emergency department for lower lumbar back pain after falling off a ladder.  Patient states pain radiates to the right leg to the anterior tight area. Patient denies loss of consciousness, increased urinary incontinence, fecal incontinence.  Patient is not taking any blood thinners.      Physical Exam   Triage Vital Signs: ED Triage Vitals  Encounter Vitals Group     BP 05/12/23 2007 136/74     Systolic BP Percentile --      Diastolic BP Percentile --      Pulse Rate 05/12/23 2007 80     Resp 05/12/23 2007 18     Temp 05/12/23 2007 97.8 F (36.6 C)     Temp Source 05/12/23 2007 Oral     SpO2 05/12/23 2007 100 %     Weight 05/12/23 2006 230 lb (104.3 kg)     Height 05/12/23 2006 5\' 10"  (1.778 m)     Head Circumference --      Peak Flow --      Pain Score 05/12/23 2006 10     Pain Loc --      Pain Education --      Exclude from Growth Chart --     Most recent vital signs: Vitals:   05/12/23 2007  BP: 136/74  Pulse: 80  Resp: 18  Temp: 97.8 F (36.6 C)  SpO2: 100%     Constitutional: Alert, NAD. Able to speak in complete sentences without cough or dyspnea.  Patient is standing up and is walking in the room. Eyes: Conjunctivae are normal.  Head: Atraumatic. Nose: No congestion/rhinnorhea. Mouth/Throat: Mucous membranes are moist.   Neck: Painless ROM. Supple. No JVD, nodes, thyromegaly  Cardiovascular:   Good peripheral circulation.RRR no murmurs, gallops, rubs  Respiratory: Normal respiratory effort.  No retractions. Clear to auscultation bilaterally without wheezing or crackles  Gastrointestinal: Soft and nontender.  Musculoskeletal:  no deformity Lumbar spine: Skin is intact no  ecchymosis or hematomas, tender to palpation in right lumbar area.  Strength 5/5, sensation intact.  Neurologic:  MAE spontaneously. No gross focal neurologic deficits are appreciated.  Skin:  Skin is warm, dry and intact. No rash noted. Psychiatric: Mood and affect are normal. Speech and behavior are normal.    ED Results / Procedures / Treatments   Labs (all labs ordered are listed, but only abnormal results are displayed) Labs Reviewed - No data to display   EKG     RADIOLOGY I independently reviewed and interpreted imaging and agree with radiologists findings.      PROCEDURES:  Critical Care performed:   Procedures   MEDICATIONS ORDERED IN ED: Medications  ibuprofen (ADVIL) tablet 600 mg (has no administration in time range)   Clinical Course as of 05/12/23 2112  Thu May 12, 2023  2106 DG Lumbar Spine Complete Mild degenerative changes. [AE]    Clinical Course User Index [AE] Gladys Damme, PA-C    IMPRESSION / MDM / ASSESSMENT AND PLAN / ED COURSE  I reviewed the triage vital signs and the nursing notes.  Differential diagnosis includes, but is not limited to, fracture, dislocation, radiculopathy  Patient's presentation is  most consistent with acute complicated illness / injury requiring diagnostic workup.   Patient's diagnosis is consistent with fall, lumbar back pain. I independently reviewed and interpreted imaging and agree with radiologists findings ruling out fracture.  Physical exam is reassuring. I did review the patient's allergies and medications.During admission patient received ibuprofen 600 mg.  The patient is in stable and satisfactory condition for discharge home  Patient will be discharged home with prescriptions for meloxicam. Patient is to follow up with CP as needed or otherwise directed. Patient is given ED precautions to return to the ED for any worsening or new symptoms. Discussed plan of care with patient, answered all of patient's  questions, Patient agreeable to plan of care. Advised patient to take medications according to the instructions on the label. Discussed possible side effects of new medications. Patient verbalized understanding.    FINAL CLINICAL IMPRESSION(S) / ED DIAGNOSES   Final diagnoses:  Fall, initial encounter  Acute right-sided low back pain with sciatica, sciatica laterality unspecified     Rx / DC Orders   ED Discharge Orders          Ordered    naproxen (NAPROSYN) 500 MG tablet  2 times daily with meals        05/12/23 2112             Note:  This document was prepared using Dragon voice recognition software and may include unintentional dictation errors.   Gladys Damme, PA-C 05/12/23 2112    Trinna Post, MD 05/12/23 2329

## 2023-05-12 NOTE — ED Triage Notes (Addendum)
 Patient ambulatory to triage with steady gait, without difficulty or distress noted; pt reports falling off ladder approx 6pm; c/o lower back pain; denies any other c/o or injuries

## 2023-10-29 ENCOUNTER — Inpatient Hospital Stay (HOSPITAL_COMMUNITY)

## 2023-10-29 ENCOUNTER — Emergency Department (HOSPITAL_COMMUNITY)

## 2023-10-29 ENCOUNTER — Inpatient Hospital Stay (HOSPITAL_COMMUNITY)
Admission: EM | Admit: 2023-10-29 | Discharge: 2023-11-16 | DRG: 955 | Disposition: E | Attending: Surgery | Admitting: Surgery

## 2023-10-29 DIAGNOSIS — Y92410 Unspecified street and highway as the place of occurrence of the external cause: Secondary | ICD-10-CM | POA: Diagnosis not present

## 2023-10-29 DIAGNOSIS — S3210XA Unspecified fracture of sacrum, initial encounter for closed fracture: Secondary | ICD-10-CM | POA: Diagnosis present

## 2023-10-29 DIAGNOSIS — Z79899 Other long term (current) drug therapy: Secondary | ICD-10-CM

## 2023-10-29 DIAGNOSIS — Z885 Allergy status to narcotic agent status: Secondary | ICD-10-CM

## 2023-10-29 DIAGNOSIS — Z23 Encounter for immunization: Secondary | ICD-10-CM | POA: Diagnosis not present

## 2023-10-29 DIAGNOSIS — I959 Hypotension, unspecified: Secondary | ICD-10-CM | POA: Diagnosis present

## 2023-10-29 DIAGNOSIS — S06A1XA Traumatic brain compression with herniation, initial encounter: Secondary | ICD-10-CM | POA: Diagnosis present

## 2023-10-29 DIAGNOSIS — G08 Intracranial and intraspinal phlebitis and thrombophlebitis: Secondary | ICD-10-CM | POA: Diagnosis present

## 2023-10-29 DIAGNOSIS — R402433 Glasgow coma scale score 3-8, at hospital admission: Secondary | ICD-10-CM | POA: Diagnosis present

## 2023-10-29 DIAGNOSIS — H9222 Otorrhagia, left ear: Secondary | ICD-10-CM | POA: Diagnosis present

## 2023-10-29 DIAGNOSIS — Z01810 Encounter for preprocedural cardiovascular examination: Secondary | ICD-10-CM | POA: Diagnosis not present

## 2023-10-29 DIAGNOSIS — I272 Pulmonary hypertension, unspecified: Secondary | ICD-10-CM | POA: Diagnosis present

## 2023-10-29 DIAGNOSIS — S0219XA Other fracture of base of skull, initial encounter for closed fracture: Secondary | ICD-10-CM | POA: Diagnosis present

## 2023-10-29 DIAGNOSIS — I619 Nontraumatic intracerebral hemorrhage, unspecified: Secondary | ICD-10-CM | POA: Diagnosis not present

## 2023-10-29 DIAGNOSIS — D62 Acute posthemorrhagic anemia: Secondary | ICD-10-CM | POA: Diagnosis present

## 2023-10-29 DIAGNOSIS — J1569 Pneumonia due to other gram-negative bacteria: Secondary | ICD-10-CM | POA: Diagnosis not present

## 2023-10-29 DIAGNOSIS — J80 Acute respiratory distress syndrome: Secondary | ICD-10-CM | POA: Diagnosis not present

## 2023-10-29 DIAGNOSIS — Z529 Donor of unspecified organ or tissue: Secondary | ICD-10-CM

## 2023-10-29 DIAGNOSIS — F17210 Nicotine dependence, cigarettes, uncomplicated: Secondary | ICD-10-CM | POA: Diagnosis not present

## 2023-10-29 DIAGNOSIS — S02119A Unspecified fracture of occiput, initial encounter for closed fracture: Secondary | ICD-10-CM | POA: Diagnosis present

## 2023-10-29 DIAGNOSIS — S2242XA Multiple fractures of ribs, left side, initial encounter for closed fracture: Secondary | ICD-10-CM | POA: Diagnosis present

## 2023-10-29 DIAGNOSIS — N179 Acute kidney failure, unspecified: Secondary | ICD-10-CM | POA: Diagnosis present

## 2023-10-29 DIAGNOSIS — G839 Paralytic syndrome, unspecified: Secondary | ICD-10-CM | POA: Diagnosis present

## 2023-10-29 DIAGNOSIS — Z791 Long term (current) use of non-steroidal anti-inflammatories (NSAID): Secondary | ICD-10-CM

## 2023-10-29 DIAGNOSIS — J9601 Acute respiratory failure with hypoxia: Secondary | ICD-10-CM

## 2023-10-29 DIAGNOSIS — T07XXXA Unspecified multiple injuries, initial encounter: Secondary | ICD-10-CM

## 2023-10-29 DIAGNOSIS — R578 Other shock: Secondary | ICD-10-CM | POA: Diagnosis not present

## 2023-10-29 DIAGNOSIS — E876 Hypokalemia: Secondary | ICD-10-CM | POA: Diagnosis not present

## 2023-10-29 DIAGNOSIS — G9382 Brain death: Secondary | ICD-10-CM | POA: Diagnosis not present

## 2023-10-29 DIAGNOSIS — S27321A Contusion of lung, unilateral, initial encounter: Secondary | ICD-10-CM | POA: Diagnosis not present

## 2023-10-29 DIAGNOSIS — S020XXA Fracture of vault of skull, initial encounter for closed fracture: Secondary | ICD-10-CM | POA: Diagnosis present

## 2023-10-29 DIAGNOSIS — J15211 Pneumonia due to Methicillin susceptible Staphylococcus aureus: Secondary | ICD-10-CM | POA: Diagnosis not present

## 2023-10-29 DIAGNOSIS — S066X3A Traumatic subarachnoid hemorrhage with loss of consciousness of 1 hour to 5 hours 59 minutes, initial encounter: Secondary | ICD-10-CM

## 2023-10-29 DIAGNOSIS — E870 Hyperosmolality and hypernatremia: Secondary | ICD-10-CM | POA: Diagnosis not present

## 2023-10-29 DIAGNOSIS — S064XAA Epidural hemorrhage with loss of consciousness status unknown, initial encounter: Secondary | ICD-10-CM

## 2023-10-29 DIAGNOSIS — S0291XB Unspecified fracture of skull, initial encounter for open fracture: Secondary | ICD-10-CM

## 2023-10-29 DIAGNOSIS — S065XAA Traumatic subdural hemorrhage with loss of consciousness status unknown, initial encounter: Secondary | ICD-10-CM

## 2023-10-29 DIAGNOSIS — S06309A Unspecified focal traumatic brain injury with loss of consciousness of unspecified duration, initial encounter: Secondary | ICD-10-CM | POA: Diagnosis present

## 2023-10-29 DIAGNOSIS — S22049A Unspecified fracture of fourth thoracic vertebra, initial encounter for closed fracture: Secondary | ICD-10-CM | POA: Diagnosis present

## 2023-10-29 DIAGNOSIS — J189 Pneumonia, unspecified organism: Secondary | ICD-10-CM | POA: Diagnosis not present

## 2023-10-29 DIAGNOSIS — F331 Major depressive disorder, recurrent, moderate: Secondary | ICD-10-CM | POA: Diagnosis not present

## 2023-10-29 DIAGNOSIS — G932 Benign intracranial hypertension: Secondary | ICD-10-CM | POA: Diagnosis present

## 2023-10-29 DIAGNOSIS — F17200 Nicotine dependence, unspecified, uncomplicated: Secondary | ICD-10-CM | POA: Diagnosis present

## 2023-10-29 LAB — POCT I-STAT 7, (LYTES, BLD GAS, ICA,H+H)
Acid-base deficit: 1 mmol/L (ref 0.0–2.0)
Bicarbonate: 24.1 mmol/L (ref 20.0–28.0)
Calcium, Ion: 1.14 mmol/L — ABNORMAL LOW (ref 1.15–1.40)
HCT: 40 % (ref 39.0–52.0)
Hemoglobin: 13.6 g/dL (ref 13.0–17.0)
O2 Saturation: 100 %
Patient temperature: 97.5
Potassium: 3 mmol/L — ABNORMAL LOW (ref 3.5–5.1)
Sodium: 138 mmol/L (ref 135–145)
TCO2: 25 mmol/L (ref 22–32)
pCO2 arterial: 38.7 mmHg (ref 32–48)
pH, Arterial: 7.4 (ref 7.35–7.45)
pO2, Arterial: 505 mmHg — ABNORMAL HIGH (ref 83–108)

## 2023-10-29 LAB — COMPREHENSIVE METABOLIC PANEL WITH GFR
ALT: 17 U/L (ref 0–44)
AST: 21 U/L (ref 15–41)
Albumin: 3.3 g/dL — ABNORMAL LOW (ref 3.5–5.0)
Alkaline Phosphatase: 44 U/L (ref 38–126)
Anion gap: 10 (ref 5–15)
BUN: 12 mg/dL (ref 6–20)
CO2: 26 mmol/L (ref 22–32)
Calcium: 8 mg/dL — ABNORMAL LOW (ref 8.9–10.3)
Chloride: 105 mmol/L (ref 98–111)
Creatinine, Ser: 1.24 mg/dL (ref 0.61–1.24)
GFR, Estimated: >60 mL/min (ref >60)
Glucose, Bld: 103 mg/dL — ABNORMAL HIGH (ref 70–99)
Potassium: 3.3 mmol/L — ABNORMAL LOW (ref 3.5–5.1)
Sodium: 141 mmol/L (ref 135–145)
Total Bilirubin: 1 mg/dL (ref 0.0–1.2)
Total Protein: 5.6 g/dL — ABNORMAL LOW (ref 6.5–8.1)

## 2023-10-29 LAB — SODIUM: Sodium: 140 mmol/L (ref 135–145)

## 2023-10-29 LAB — URINALYSIS, ROUTINE W REFLEX MICROSCOPIC
Bilirubin Urine: NEGATIVE
Glucose, UA: 50 mg/dL — AB
Hgb urine dipstick: NEGATIVE
Ketones, ur: 5 mg/dL — AB
Leukocytes,Ua: NEGATIVE
Nitrite: NEGATIVE
Protein, ur: NEGATIVE mg/dL
Specific Gravity, Urine: 1.018 (ref 1.005–1.030)
pH: 7 (ref 5.0–8.0)

## 2023-10-29 LAB — CBC
HCT: 36.3 % — ABNORMAL LOW (ref 39.0–52.0)
Hemoglobin: 11.9 g/dL — ABNORMAL LOW (ref 13.0–17.0)
MCH: 28.7 pg (ref 26.0–34.0)
MCHC: 32.8 g/dL (ref 30.0–36.0)
MCV: 87.7 fL (ref 80.0–100.0)
Platelets: 140 K/uL — ABNORMAL LOW (ref 150–400)
RBC: 4.14 MIL/uL — ABNORMAL LOW (ref 4.22–5.81)
RDW: 13.2 % (ref 11.5–15.5)
WBC: 5.1 K/uL (ref 4.0–10.5)
nRBC: 0 % (ref 0.0–0.2)

## 2023-10-29 LAB — ETHANOL: Alcohol, Ethyl (B): <15 mg/dL (ref <15)

## 2023-10-29 LAB — SAMPLE TO BLOOD BANK

## 2023-10-29 LAB — I-STAT CG4 LACTIC ACID, ED: Lactic Acid, Venous: 1.2 mmol/L (ref 0.5–1.9)

## 2023-10-29 LAB — PROTIME-INR
INR: 1.2 (ref 0.8–1.2)
Prothrombin Time: 16.2 s — ABNORMAL HIGH (ref 11.4–15.2)

## 2023-10-29 LAB — I-STAT CHEM 8, ED
BUN: 14 mg/dL (ref 6–20)
Calcium, Ion: 1.07 mmol/L — ABNORMAL LOW (ref 1.15–1.40)
Chloride: 105 mmol/L (ref 98–111)
Creatinine, Ser: 1.3 mg/dL — ABNORMAL HIGH (ref 0.61–1.24)
Glucose, Bld: 99 mg/dL (ref 70–99)
HCT: 34 % — ABNORMAL LOW (ref 39.0–52.0)
Hemoglobin: 11.6 g/dL — ABNORMAL LOW (ref 13.0–17.0)
Potassium: 3.2 mmol/L — ABNORMAL LOW (ref 3.5–5.1)
Sodium: 143 mmol/L (ref 135–145)
TCO2: 25 mmol/L (ref 22–32)

## 2023-10-29 LAB — HIV ANTIBODY (ROUTINE TESTING W REFLEX): HIV Screen 4th Generation wRfx: NONREACTIVE

## 2023-10-29 LAB — MRSA NEXT GEN BY PCR, NASAL: MRSA by PCR Next Gen: NOT DETECTED

## 2023-10-29 MED ORDER — SUCCINYLCHOLINE CHLORIDE 20 MG/ML IJ SOLN
INTRAMUSCULAR | Status: AC | PRN
  Administered 2023-10-29: 120 mg via INTRAVENOUS

## 2023-10-29 MED ORDER — POLYETHYLENE GLYCOL 3350 17 G PO PACK
17.0000 g | PACK | Freq: Every day | ORAL | Status: DC
  Administered 2023-10-31 – 2023-11-01 (×2): 17 g
  Filled 2023-10-29 (×2): qty 1

## 2023-10-29 MED ORDER — DOCUSATE SODIUM 50 MG/5ML PO LIQD
100.0000 mg | Freq: Two times a day (BID) | ORAL | Status: DC
  Administered 2023-10-29 – 2023-11-08 (×16): 100 mg
  Filled 2023-10-29 (×16): qty 10

## 2023-10-29 MED ORDER — CHLORHEXIDINE GLUCONATE CLOTH 2 % EX PADS
6.0000 | MEDICATED_PAD | Freq: Every day | CUTANEOUS | Status: DC
Start: 2023-10-29 — End: 2023-11-05
  Administered 2023-10-29 – 2023-11-05 (×9): 6 via TOPICAL

## 2023-10-29 MED ORDER — SODIUM CHLORIDE 3 % IV SOLN
INTRAVENOUS | Status: DC
  Filled 2023-10-29 (×3): qty 500

## 2023-10-29 MED ORDER — METHOCARBAMOL 1000 MG/10ML IJ SOLN
500.0000 mg | Freq: Three times a day (TID) | INTRAMUSCULAR | Status: AC
  Administered 2023-10-30 – 2023-11-01 (×6): 500 mg via INTRAVENOUS
  Filled 2023-10-29 (×6): qty 10

## 2023-10-29 MED ORDER — ETOMIDATE 2 MG/ML IV SOLN
INTRAVENOUS | Status: AC | PRN
  Administered 2023-10-29: 20 mg via INTRAVENOUS

## 2023-10-29 MED ORDER — CEFAZOLIN SODIUM-DEXTROSE 2-4 GM/100ML-% IV SOLN
2.0000 g | Freq: Once | INTRAVENOUS | Status: AC
  Administered 2023-10-29: 2 g via INTRAVENOUS

## 2023-10-29 MED ORDER — FENTANYL CITRATE PF 50 MCG/ML IJ SOSY: 25.0000 ug | PREFILLED_SYRINGE | Freq: Once | INTRAMUSCULAR | Status: DC

## 2023-10-29 MED ORDER — FENTANYL CITRATE PF 50 MCG/ML IJ SOSY
50.0000 ug | PREFILLED_SYRINGE | INTRAMUSCULAR | Status: DC | PRN
  Administered 2023-11-01 – 2023-11-03 (×3): 100 ug via INTRAVENOUS

## 2023-10-29 MED ORDER — POLYETHYLENE GLYCOL 3350 17 G PO PACK
17.0000 g | PACK | Freq: Every day | ORAL | Status: DC | PRN
  Administered 2023-11-02: 17 g

## 2023-10-29 MED ORDER — CALCIUM GLUCONATE-NACL 2-0.675 GM/100ML-% IV SOLN
2.0000 g | Freq: Once | INTRAVENOUS | Status: AC
  Administered 2023-10-29: 2000 mg via INTRAVENOUS
  Filled 2023-10-29: qty 100

## 2023-10-29 MED ORDER — LIDOCAINE-EPINEPHRINE 1 %-1:100000 IJ SOLN
20.0000 mL | Freq: Once | INTRAMUSCULAR | Status: AC
  Administered 2023-10-29: 20 mL via INTRADERMAL
  Filled 2023-10-29: qty 1

## 2023-10-29 MED ORDER — IOHEXOL 350 MG/ML SOLN
75.0000 mL | Freq: Once | INTRAVENOUS | Status: AC | PRN
  Administered 2023-10-29: 75 mL via INTRAVENOUS

## 2023-10-29 MED ORDER — SODIUM CHLORIDE 0.9 % IV SOLN: INTRAVENOUS | Status: DC

## 2023-10-29 MED ORDER — ORAL CARE MOUTH RINSE
15.0000 mL | OROMUCOSAL | Status: DC
  Administered 2023-10-29 – 2023-11-09 (×132): 15 mL via OROMUCOSAL

## 2023-10-29 MED ORDER — LEVETIRACETAM (KEPPRA) 500 MG/5 ML ADULT IV PUSH
500.0000 mg | Freq: Two times a day (BID) | INTRAVENOUS | Status: AC
  Administered 2023-10-29 – 2023-11-05 (×14): 500 mg via INTRAVENOUS
  Filled 2023-10-29 (×14): qty 5

## 2023-10-29 MED ORDER — FENTANYL BOLUS VIA INFUSION
25.0000 ug | INTRAVENOUS | Status: DC | PRN
  Administered 2023-10-29: 100 ug via INTRAVENOUS

## 2023-10-29 MED ORDER — ATROPINE SULFATE 1 MG/10ML IJ SOSY
PREFILLED_SYRINGE | INTRAMUSCULAR | Status: AC | PRN
  Administered 2023-10-29: 1 mg via INTRAVENOUS

## 2023-10-29 MED ORDER — PROPOFOL 1000 MG/100ML IV EMUL
0.0000 ug/kg/min | INTRAVENOUS | Status: AC
  Administered 2023-10-29: 20 ug/kg/min via INTRAVENOUS
  Administered 2023-10-29: 60 ug/kg/min via INTRAVENOUS
  Administered 2023-10-30 (×3): 70 ug/kg/min via INTRAVENOUS
  Administered 2023-10-31: 50 ug/kg/min via INTRAVENOUS
  Administered 2023-10-31: 20 ug/kg/min via INTRAVENOUS
  Filled 2023-10-29 (×7): qty 100

## 2023-10-29 MED ORDER — LEVETIRACETAM (KEPPRA) 500 MG/5 ML ADULT IV PUSH
1000.0000 mg | Freq: Once | INTRAVENOUS | Status: AC
  Administered 2023-10-29: 1000 mg via INTRAVENOUS

## 2023-10-29 MED ORDER — ACETAMINOPHEN 500 MG PO TABS
1000.0000 mg | ORAL_TABLET | Freq: Four times a day (QID) | ORAL | Status: DC
  Administered 2023-10-29 – 2023-11-08 (×38): 1000 mg
  Filled 2023-10-29 (×40): qty 2

## 2023-10-29 MED ORDER — FENTANYL 2500MCG IN NS 250ML (10MCG/ML) PREMIX INFUSION
0.0000 ug/h | INTRAVENOUS | Status: DC
  Administered 2023-10-29: 200 ug/h via INTRAVENOUS
  Administered 2023-10-30: 175 ug/h via INTRAVENOUS
  Administered 2023-10-30: 200 ug/h via INTRAVENOUS
  Administered 2023-10-31: 100 ug/h via INTRAVENOUS
  Administered 2023-10-31: 250 ug/h via INTRAVENOUS
  Administered 2023-11-01: 100 ug/h via INTRAVENOUS
  Administered 2023-11-02: 275 ug/h via INTRAVENOUS
  Filled 2023-10-29 (×6): qty 250

## 2023-10-29 MED ORDER — HYDRALAZINE HCL 20 MG/ML IJ SOLN
10.0000 mg | INTRAMUSCULAR | Status: DC | PRN
  Filled 2023-10-29: qty 1

## 2023-10-29 MED ORDER — FENTANYL 2500MCG IN NS 250ML (10MCG/ML) PREMIX INFUSION
0.0000 ug/h | INTRAVENOUS | Status: DC
  Administered 2023-10-29: 150 ug/h via INTRAVENOUS
  Filled 2023-10-29: qty 250

## 2023-10-29 MED ORDER — METHOCARBAMOL 500 MG PO TABS
500.0000 mg | ORAL_TABLET | Freq: Three times a day (TID) | ORAL | Status: AC
  Administered 2023-10-30 – 2023-10-31 (×2): 500 mg
  Filled 2023-10-29 (×2): qty 1

## 2023-10-29 MED ORDER — FENTANYL BOLUS VIA INFUSION
25.0000 ug | INTRAVENOUS | Status: DC | PRN
  Administered 2023-10-29 – 2023-10-30 (×4): 100 ug via INTRAVENOUS
  Administered 2023-10-30 (×2): 50 ug via INTRAVENOUS
  Administered 2023-10-30 – 2023-10-31 (×2): 100 ug via INTRAVENOUS
  Administered 2023-10-31 (×2): 50 ug via INTRAVENOUS
  Administered 2023-10-31 (×2): 100 ug via INTRAVENOUS
  Administered 2023-10-31: 50 ug via INTRAVENOUS
  Administered 2023-10-31 – 2023-11-01 (×4): 100 ug via INTRAVENOUS
  Administered 2023-11-01: 50 ug via INTRAVENOUS
  Administered 2023-11-01 – 2023-11-02 (×8): 100 ug via INTRAVENOUS
  Administered 2023-11-02: 50 ug via INTRAVENOUS
  Administered 2023-11-02 – 2023-11-03 (×10): 100 ug via INTRAVENOUS

## 2023-10-29 MED ORDER — MIDAZOLAM HCL 2 MG/2ML IJ SOLN: 1.0000 mg | INTRAMUSCULAR | Status: DC | PRN

## 2023-10-29 MED ORDER — TETANUS-DIPHTH-ACELL PERTUSSIS 5-2.5-18.5 LF-MCG/0.5 IM SUSY
0.5000 mL | PREFILLED_SYRINGE | Freq: Once | INTRAMUSCULAR | Status: AC
  Administered 2023-10-29: 0.5 mL via INTRAMUSCULAR

## 2023-10-29 MED ORDER — FENTANYL CITRATE PF 50 MCG/ML IJ SOSY: 50.0000 ug | PREFILLED_SYRINGE | INTRAMUSCULAR | Status: DC | PRN

## 2023-10-29 MED ORDER — ONDANSETRON HCL 4 MG/2ML IJ SOLN: 4.0000 mg | Freq: Four times a day (QID) | INTRAMUSCULAR | Status: DC | PRN

## 2023-10-29 MED ORDER — ONDANSETRON 4 MG PO TBDP: 4.0000 mg | ORAL_TABLET | Freq: Four times a day (QID) | ORAL | Status: DC | PRN

## 2023-10-29 MED ORDER — METOPROLOL TARTRATE 5 MG/5ML IV SOLN: 5.0000 mg | Freq: Four times a day (QID) | INTRAVENOUS | Status: DC | PRN

## 2023-10-29 MED ORDER — ORAL CARE MOUTH RINSE: 15.0000 mL | OROMUCOSAL | Status: DC | PRN

## 2023-10-29 NOTE — Progress Notes (Signed)
 Patient belongings assessed an documented in chart along with $22 [place]  paper currency and 2.68 in [place]  coin currency. Also found a bag of marijuana and white crystal substance. Security called and drugs given directly to Tax adviser.
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
## 2023-10-29 NOTE — ED Notes (Signed)
 20mg  bolus of propofol  administered x2 from infusion
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
## 2023-10-29 NOTE — ED Notes (Signed)
 X-ray at bedside.

## 2023-10-29 NOTE — ED Notes (Signed)
 Pt infrequently moves, primarily only when responding to pain.
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
## 2023-10-29 NOTE — Progress Notes (Signed)
 ICP monitor placed.  Pressure elevated in the mid 20s.  Begin hypertonic saline.  Keep head elevated.  Work for mild hyperventilation.  Goal cerebral perfusion pressures above 60.
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
## 2023-10-29 NOTE — Progress Notes (Signed)
 Pt orally intubated by ED MD w/ 8.0 @ 25cm. + ETCO2; +BBS. Pt placed on vent at documented settings.
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
## 2023-10-29 NOTE — Procedures (Signed)
   Procedure Note  Date: 10/29/2023  Procedure: central venous catheter placement--right, internal jugular vein, with ultrasound guidance  Pre-op diagnosis: 3% administration Post-op diagnosis: same  Surgeon: Dreama GEANNIE Hanger, MD  Anesthesia: local  EBL: <5cc Drains/Implants: triple lumen central venous catheter  Description of procedure: Time-out was performed verifying correct patient, procedure, site, laterality. The right neck was prepped and draped in the usual sterile fashion. The right internal jugular vein was localized with ultrasound guidance. Five ccs of local anesthetic was infiltrated at the site of venous access. The right internal jugular vein was accessed using an introducer needle and a guidewire passed through the needle. The needle was removed and a skin nick was made. The tract was dilated and the central venous catheter advanced over the guidewire followed by removal of the guidewire. All ports drew blood easily and all were flushed with saline. The catheter was secured to the skin with suture and a sterile dressing. The patient tolerated the procedure well. There were no immediate complications. Follow up chest x-ray was ordered to confirm positioning and the absence of a pneumothorax.    Dreama GEANNIE Hanger, MD General and Trauma Surgery [facility]
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
## 2023-10-29 NOTE — ED Notes (Signed)
 Transported to CT

## 2023-10-29 NOTE — ED Notes (Signed)
 C-collar changed to [id number] at this time

## 2023-10-29 NOTE — Progress Notes (Signed)
 RT transported pt from ED to [id number] with RN at bedside. No complications at this time.

## 2023-10-30 ENCOUNTER — Inpatient Hospital Stay (HOSPITAL_COMMUNITY)

## 2023-10-30 LAB — BASIC METABOLIC PANEL WITH GFR
Anion gap: 10 (ref 5–15)
BUN: 8 mg/dL (ref 6–20)
CO2: 23 mmol/L (ref 22–32)
Calcium: 8.4 mg/dL — ABNORMAL LOW (ref 8.9–10.3)
Chloride: 107 mmol/L (ref 98–111)
Creatinine, Ser: 0.85 mg/dL (ref 0.61–1.24)
GFR, Estimated: >60 mL/min (ref >60)
Glucose, Bld: 121 mg/dL — ABNORMAL HIGH (ref 70–99)
Potassium: 3.4 mmol/L — ABNORMAL LOW (ref 3.5–5.1)
Sodium: 140 mmol/L (ref 135–145)

## 2023-10-30 LAB — CBC
HCT: 35.4 % — ABNORMAL LOW (ref 39.0–52.0)
Hemoglobin: 11.7 g/dL — ABNORMAL LOW (ref 13.0–17.0)
MCH: 28.3 pg (ref 26.0–34.0)
MCHC: 33.1 g/dL (ref 30.0–36.0)
MCV: 85.7 fL (ref 80.0–100.0)
Platelets: 175 K/uL (ref 150–400)
RBC: 4.13 MIL/uL — ABNORMAL LOW (ref 4.22–5.81)
RDW: 13.2 % (ref 11.5–15.5)
WBC: 8.2 K/uL (ref 4.0–10.5)
nRBC: 0 % (ref 0.0–0.2)

## 2023-10-30 LAB — TRIGLYCERIDES: Triglycerides: 80 mg/dL (ref <150)

## 2023-10-30 LAB — MAGNESIUM: Magnesium: 2 mg/dL (ref 1.7–2.4)

## 2023-10-30 LAB — SODIUM
Sodium: 143 mmol/L (ref 135–145)
Sodium: 143 mmol/L (ref 135–145)

## 2023-10-30 LAB — PHOSPHORUS: Phosphorus: 2.3 mg/dL — ABNORMAL LOW (ref 2.5–4.6)

## 2023-10-30 MED ORDER — POTASSIUM CHLORIDE 20 MEQ PO PACK
40.0000 meq | PACK | Freq: Once | ORAL | Status: AC
Start: 2023-10-30 — End: 2023-10-30
  Administered 2023-10-30: 40 meq
  Filled 2023-10-30: qty 2

## 2023-10-30 MED ORDER — SODIUM CHLORIDE 3 % IV SOLN
INTRAVENOUS | Status: DC
  Filled 2023-10-30 (×3): qty 500

## 2023-10-30 MED ORDER — PROSOURCE TF20 ENFIT COMPATIBL EN LIQD
60.0000 mL | Freq: Every day | ENTERAL | Status: DC
  Administered 2023-10-30 – 2023-10-31 (×2): 60 mL
  Filled 2023-10-30 (×2): qty 60

## 2023-10-30 MED ORDER — MIDAZOLAM HCL 2 MG/2ML IJ SOLN
2.0000 mg | INTRAMUSCULAR | Status: DC | PRN
  Administered 2023-11-01 – 2023-11-03 (×5): 2 mg via INTRAVENOUS
  Filled 2023-10-30 (×5): qty 2

## 2023-10-30 MED ORDER — VITAL HP 1.0 CAL PO LIQD
1000.0000 mL | ORAL | Status: DC
  Administered 2023-10-30: 1000 mL

## 2023-10-30 MED ORDER — LACTATED RINGERS IV BOLUS
1000.0000 mL | Freq: Once | INTRAVENOUS | Status: AC
  Administered 2023-10-30: 1000 mL via INTRAVENOUS

## 2023-10-30 NOTE — Progress Notes (Signed)
 This RN, and [employer], noted a gradual decline in UOP, CPP, and HR (sustaining in low to mid 50's) throughout the shift. Consulted the NSGY and Trauma MD about plan of care, and benefits vs. risks of fluid boluses, implementing vasopressors, and/or increasing 3% saline.  Verbal orders from Gerard Beck, NP, to increase 3% saline to 100mL from 75mL to increase Na levels, and verbal orders to administer a 1L LR bolus from Dr. Valentine, with Trauma, to correct bradycardia, decreasing UOP, and passively raise CPP.  Araceli Coufal, RN

## 2023-10-30 NOTE — Progress Notes (Signed)
 Patient seen and examined this morning.  Remains intubated and sedated.  Pupils 3 mm and reactive bilaterally.  ICPs in the mid and upper 20s.  CPP is above 60.  Follow-up head CT scan demonstrates mild progression of his left parietal occipital epidural hematoma.  There is also some subdural component extending toward his superior sagittal sinus.  He continues to have significant cerebral edema with marked effacement of his basilar cisterns with some accompanying subarachnoid blood.  Patient with severe depressed parietal occipital bilateral skull fracture with likely venous epidural hematoma and possible injury to the superior sagittal sinus.  I continue to believe that elevation of this fracture would be met with severe hemorrhage and little possibility of open repair to the superior sagittal sinus.  I continue to think that his best option is that of aggressive ICP control with hypertonic saline and head elevation.  Overall I am afraid his prognosis is very poor.
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
## 2023-10-30 NOTE — Progress Notes (Addendum)
   10/30/23 0100  Neurological  R Pupil Size (mm) 3  R Pupil Shape Round  R Pupil Reaction Brisk  L Pupil Size (mm) (S)  4  L Pupil Shape Round  L Pupil Reaction Brisk   [name] notified of pupil change. No stat intervention, but continue plan of care for CT head at 0500  Addendum- 0430 f/u head CT WO done. [name] notified of scan completion, pupil assessment of R3 L5, and increasing ICP to high 30s and 40 mmHg. Continue POC per MD.

## 2023-10-30 NOTE — Progress Notes (Signed)
 This RN received a call from patients' ex-wife inquiring about an update at this time. This RN politely informed on our policy of giving out information over the phone without speaking to next of kin first (related to the patient just arriving last night, and prioritizing Mr. Kolenda privacy). Set up a patient password with CeCe, who is the patients' eldest daughter, and sister of the patient, Farrel (who goes by Congo). Password was updated in the chart, and 4NICU specific policies were explained and plan of care also explained, and updated as updates permit.   Keon Pender, RN

## 2023-10-31 ENCOUNTER — Inpatient Hospital Stay (HOSPITAL_COMMUNITY)

## 2023-10-31 ENCOUNTER — Encounter (HOSPITAL_COMMUNITY): Payer: Self-pay

## 2023-10-31 ENCOUNTER — Other Ambulatory Visit: Payer: Self-pay

## 2023-10-31 DIAGNOSIS — S22049A Unspecified fracture of fourth thoracic vertebra, initial encounter for closed fracture: Secondary | ICD-10-CM | POA: Diagnosis not present

## 2023-10-31 DIAGNOSIS — S2242XA Multiple fractures of ribs, left side, initial encounter for closed fracture: Secondary | ICD-10-CM | POA: Diagnosis not present

## 2023-10-31 DIAGNOSIS — J1569 Pneumonia due to other gram-negative bacteria: Secondary | ICD-10-CM | POA: Diagnosis not present

## 2023-10-31 DIAGNOSIS — Z23 Encounter for immunization: Secondary | ICD-10-CM | POA: Diagnosis not present

## 2023-10-31 DIAGNOSIS — J80 Acute respiratory distress syndrome: Secondary | ICD-10-CM | POA: Diagnosis not present

## 2023-10-31 DIAGNOSIS — R402433 Glasgow coma scale score 3-8, at hospital admission: Secondary | ICD-10-CM | POA: Diagnosis not present

## 2023-10-31 DIAGNOSIS — S27321A Contusion of lung, unilateral, initial encounter: Secondary | ICD-10-CM | POA: Diagnosis not present

## 2023-10-31 DIAGNOSIS — G08 Intracranial and intraspinal phlebitis and thrombophlebitis: Secondary | ICD-10-CM | POA: Diagnosis not present

## 2023-10-31 DIAGNOSIS — G9382 Brain death: Secondary | ICD-10-CM | POA: Diagnosis not present

## 2023-10-31 DIAGNOSIS — S06A1XA Traumatic brain compression with herniation, initial encounter: Secondary | ICD-10-CM | POA: Diagnosis not present

## 2023-10-31 LAB — GLUCOSE, CAPILLARY
Glucose-Capillary: 119 mg/dL — ABNORMAL HIGH (ref 70–99)
Glucose-Capillary: 121 mg/dL — ABNORMAL HIGH (ref 70–99)

## 2023-10-31 LAB — PHOSPHORUS
Phosphorus: 2.3 mg/dL — ABNORMAL LOW (ref 2.5–4.6)
Phosphorus: 2.4 mg/dL — ABNORMAL LOW (ref 2.5–4.6)

## 2023-10-31 LAB — MAGNESIUM
Magnesium: 2.1 mg/dL (ref 1.7–2.4)
Magnesium: 2.2 mg/dL (ref 1.7–2.4)

## 2023-10-31 LAB — SODIUM
Sodium: 150 mmol/L — ABNORMAL HIGH (ref 135–145)
Sodium: 154 mmol/L — ABNORMAL HIGH (ref 135–145)
Sodium: 152 mmol/L — ABNORMAL HIGH (ref 135–145)
Sodium: 153 mmol/L — ABNORMAL HIGH (ref 135–145)

## 2023-10-31 MED ORDER — OXYCODONE HCL 5 MG PO TABS
10.0000 mg | ORAL_TABLET | Freq: Four times a day (QID) | ORAL | Status: DC
  Administered 2023-10-31 – 2023-11-07 (×29): 10 mg
  Filled 2023-10-31 (×29): qty 2

## 2023-10-31 MED ORDER — CLONAZEPAM 0.5 MG PO TABS
0.5000 mg | ORAL_TABLET | Freq: Two times a day (BID) | ORAL | Status: DC
  Administered 2023-10-31 – 2023-11-02 (×5): 0.5 mg
  Filled 2023-10-31 (×5): qty 1

## 2023-10-31 MED ORDER — KETAMINE HCL-SODIUM CHLORIDE 1000-0.69 MG/100ML-% IV SOLN
1.0000 mg/kg/h | INTRAVENOUS | Status: DC
  Administered 2023-10-31 – 2023-11-02 (×5): 0.8 mg/kg/h via INTRAVENOUS
  Administered 2023-11-03: 1 mg/kg/h via INTRAVENOUS
  Administered 2023-11-03: 0.8 mg/kg/h via INTRAVENOUS
  Administered 2023-11-04 – 2023-11-06 (×6): 1 mg/kg/h via INTRAVENOUS
  Filled 2023-10-31 (×19): qty 100

## 2023-10-31 MED ORDER — SODIUM CHLORIDE 0.9 % IV SOLN: INTRAVENOUS | Status: DC

## 2023-10-31 MED ORDER — ATROPINE SULFATE 1 MG/10ML IJ SOSY
PREFILLED_SYRINGE | INTRAMUSCULAR | Status: AC
  Filled 2023-10-31: qty 10

## 2023-10-31 MED ORDER — PIVOT 1.5 CAL PO LIQD
1000.0000 mL | ORAL | Status: DC
  Administered 2023-10-31 – 2023-11-06 (×10): 1000 mL

## 2023-10-31 MED ORDER — FREE WATER
200.0000 mL | Freq: Four times a day (QID) | Status: DC
  Administered 2023-10-31 (×4): 200 mL

## 2023-10-31 MED ORDER — THIAMINE MONONITRATE 100 MG PO TABS
100.0000 mg | ORAL_TABLET | Freq: Every day | ORAL | Status: AC
  Administered 2023-10-31 – 2023-11-06 (×7): 100 mg
  Filled 2023-10-31 (×7): qty 1

## 2023-10-31 MED ORDER — POTASSIUM & SODIUM PHOSPHATES 280-160-250 MG PO PACK
2.0000 | PACK | ORAL | Status: AC
  Administered 2023-10-31 (×3): 2
  Filled 2023-10-31: qty 1
  Filled 2023-10-31: qty 2
  Filled 2023-10-31: qty 1
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
## 2023-10-31 NOTE — Progress Notes (Signed)
 Patient's ICPs have lessened dramatically.  He now opens his eyes spontaneously.  He follows some simple commands bilaterally.  His pupils are equal and reactive.  He has some intermittent bloody drainage with CSF out of his left external auditory canal.  ICP is currently 4.  Sodium 154.  There is not much to do about his CSF otorrhea at this point other than head elevation and local care.  Have discussed packing the external auditory canal with a cottonball and continue with head elevation.  He has very small lateral ventricles and is not a candidate for ventricular drainage.  Hopefully this will resolve over time.  He has made a remarkable recovery and certainly an unexpected one given the severe nature of his pathology.  Plan to continue the ICP monitor for 1 day.  Repeat head CT scan in the morning.

## 2023-10-31 NOTE — Progress Notes (Signed)
  Progress Note   Date: 10/31/2023  Patient Name: Joe Keith        MRN#: [id number]  Review the patient's clinical findings supports the diagnosis of:   Acute blood loss anemia
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
## 2023-10-31 NOTE — Progress Notes (Signed)
 Notified Dr Paola and Dr Darnella (Neurosurgery) around 0330 about the patient sodium increasing from 143 at 09/14 at 1800 to 150 at 09/15 at 0030.  Pt running hypertonic solution at 100/hr currently. Dr Darnella ordered to cut the hypertonic solution in half to 50 ml/hr.

## 2023-10-31 NOTE — Progress Notes (Signed)
 Informed Garst MD about pts drainage in left ear as potential CSF, advice to keep pt HOB 30-45 degrees, no new order given.

## 2023-10-31 NOTE — Plan of Care (Signed)
  Problem: Clinical Measurements: Goal: Respiratory complications will improve Outcome: Progressing   Problem: Pain Managment: Goal: General experience of comfort will improve and/or be controlled Outcome: Progressing   Problem: Safety: Goal: Ability to remain free from injury will improve Outcome: Progressing   Problem: Skin Integrity: Goal: Risk for impaired skin integrity will decrease Outcome: Progressing

## 2023-10-31 NOTE — Progress Notes (Signed)
 Neuro surgery, Dr. Donn notified of resulted CT results and unequal pupils. No new orders at this time.
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
## 2023-10-31 NOTE — Procedures (Signed)
 Cortrak  Person Inserting Tube:  Joe Keith C, RD Tube Type:  Cortrak - 43 inches Tube Size:  10 Tube Location:  Left nare Secured by: Bridle Initial Placement:  Gastric Technique Used to Measure Tube Placement:  Marking at nare/corner of mouth Cortrak Secured At:  69 cm Initial Placement Verification:  Cortrak device (Registered Dieticians Only)  Cortrak Tube Team Note:  Consult received to place a Cortrak feeding tube.   No x-ray is required. RN may begin using tube.   If the tube becomes dislodged please keep the tube and contact the Cortrak team at [email or web address] for replacement.  If after hours and replacement cannot be delayed, place a NG tube and confirm placement with an abdominal x-ray.   Keith SQUIBB., RD, LDN, CNSC See [email or web address] for contact information
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
## 2023-10-31 NOTE — Progress Notes (Signed)
  Progress Note   Date: 10/31/2023  Patient Name: Joe Keith        MRN#: [id number]  Review of the patient's clinical findings supports the diagnosis of  Acute Kidney Injury

## 2023-10-31 NOTE — Progress Notes (Signed)
 Pharmacy Electrolyte Replacement  Recent Labs:  Recent Labs    10/30/23 0220 10/30/23 1148 10/31/23 0454  K 3.4*  --   --   MG  --    < > 2.1  PHOS  --    < > 2.3*  CREATININE 0.85  --   --    < > = values in this interval not displayed.    Low Critical Values (K </= 2.5, Phos </= 1, Mg </= 1) Present: None  Plan: Give K phos 280-160-250 MG packet 2 packets per tube.   Jahari Wiginton, PharmD

## 2023-11-01 ENCOUNTER — Inpatient Hospital Stay (HOSPITAL_COMMUNITY)

## 2023-11-01 ENCOUNTER — Other Ambulatory Visit: Payer: Self-pay

## 2023-11-01 LAB — CBC
HCT: 33.5 % — ABNORMAL LOW (ref 39.0–52.0)
HCT: 32.2 % — ABNORMAL LOW (ref 39.0–52.0)
Hemoglobin: 11 g/dL — ABNORMAL LOW (ref 13.0–17.0)
Hemoglobin: 10.5 g/dL — ABNORMAL LOW (ref 13.0–17.0)
MCH: 28.4 pg (ref 26.0–34.0)
MCH: 28.5 pg (ref 26.0–34.0)
MCHC: 32.8 g/dL (ref 30.0–36.0)
MCHC: 32.6 g/dL (ref 30.0–36.0)
MCV: 86.3 fL (ref 80.0–100.0)
MCV: 87.3 fL (ref 80.0–100.0)
Platelets: 155 K/uL (ref 150–400)
Platelets: 173 K/uL (ref 150–400)
RBC: 3.88 MIL/uL — ABNORMAL LOW (ref 4.22–5.81)
RBC: 3.69 MIL/uL — ABNORMAL LOW (ref 4.22–5.81)
RDW: 14.6 % (ref 11.5–15.5)
RDW: 14.6 % (ref 11.5–15.5)
WBC: 11.6 K/uL — ABNORMAL HIGH (ref 4.0–10.5)
WBC: 14.9 K/uL — ABNORMAL HIGH (ref 4.0–10.5)
nRBC: 0 % (ref 0.0–0.2)
nRBC: 0 % (ref 0.0–0.2)

## 2023-11-01 LAB — CREATININE, SERUM
Creatinine, Ser: 1.06 mg/dL (ref 0.61–1.24)
GFR, Estimated: >60 mL/min (ref >60)

## 2023-11-01 LAB — URINALYSIS, ROUTINE W REFLEX MICROSCOPIC
Bilirubin Urine: NEGATIVE
Glucose, UA: 50 mg/dL — AB
Hgb urine dipstick: NEGATIVE
Ketones, ur: NEGATIVE mg/dL
Leukocytes,Ua: NEGATIVE
Nitrite: NEGATIVE
Protein, ur: NEGATIVE mg/dL
Specific Gravity, Urine: 1.017 (ref 1.005–1.030)
pH: 7 (ref 5.0–8.0)

## 2023-11-01 LAB — GLUCOSE, CAPILLARY
Glucose-Capillary: 112 mg/dL — ABNORMAL HIGH (ref 70–99)
Glucose-Capillary: 122 mg/dL — ABNORMAL HIGH (ref 70–99)
Glucose-Capillary: 129 mg/dL — ABNORMAL HIGH (ref 70–99)
Glucose-Capillary: 133 mg/dL — ABNORMAL HIGH (ref 70–99)
Glucose-Capillary: 139 mg/dL — ABNORMAL HIGH (ref 70–99)
Glucose-Capillary: 146 mg/dL — ABNORMAL HIGH (ref 70–99)
Glucose-Capillary: 156 mg/dL — ABNORMAL HIGH (ref 70–99)

## 2023-11-01 LAB — BASIC METABOLIC PANEL WITH GFR
Anion gap: 10 (ref 5–15)
BUN: 11 mg/dL (ref 6–20)
CO2: 22 mmol/L (ref 22–32)
Calcium: 8.3 mg/dL — ABNORMAL LOW (ref 8.9–10.3)
Chloride: 110 mmol/L (ref 98–111)
Creatinine, Ser: 0.96 mg/dL (ref 0.61–1.24)
GFR, Estimated: >60 mL/min (ref >60)
Glucose, Bld: 145 mg/dL — ABNORMAL HIGH (ref 70–99)
Potassium: 3.1 mmol/L — ABNORMAL LOW (ref 3.5–5.1)
Sodium: 142 mmol/L (ref 135–145)

## 2023-11-01 LAB — MAGNESIUM: Magnesium: 1.8 mg/dL (ref 1.7–2.4)

## 2023-11-01 LAB — PHOSPHORUS: Phosphorus: 2.6 mg/dL (ref 2.5–4.6)

## 2023-11-01 LAB — SODIUM
Sodium: 147 mmol/L — ABNORMAL HIGH (ref 135–145)
Sodium: 149 mmol/L — ABNORMAL HIGH (ref 135–145)
Sodium: 149 mmol/L — ABNORMAL HIGH (ref 135–145)

## 2023-11-01 MED ORDER — ENOXAPARIN SODIUM 40 MG/0.4ML IJ SOSY
40.0000 mg | PREFILLED_SYRINGE | INTRAMUSCULAR | Status: DC
  Administered 2023-11-01 – 2023-11-02 (×2): 40 mg via SUBCUTANEOUS
  Filled 2023-11-01 (×2): qty 0.4

## 2023-11-01 MED ORDER — MANNITOL 20 % IV SOLN
50.0000 g | Freq: Once | INTRAVENOUS | Status: AC
  Administered 2023-11-01: 50 g via INTRAVENOUS
  Filled 2023-11-01: qty 500

## 2023-11-01 MED ORDER — MANNITOL 20 % IV SOLN
25.0000 g | Freq: Once | INTRAVENOUS | Status: AC
Start: 2023-11-01 — End: 2023-11-01
  Administered 2023-11-01: 25 g via INTRAVENOUS
  Filled 2023-11-01: qty 500
  Filled 2023-11-01: qty 100

## 2023-11-01 MED ORDER — POTASSIUM CHLORIDE 20 MEQ PO PACK
40.0000 meq | PACK | ORAL | Status: AC
  Administered 2023-11-01 (×2): 40 meq
  Filled 2023-11-01 (×2): qty 2

## 2023-11-01 MED ORDER — SODIUM CHLORIDE 3 % IV SOLN
INTRAVENOUS | Status: DC
Start: 2023-11-01 — End: 2023-11-10
  Filled 2023-11-01 (×9): qty 500

## 2023-11-01 MED ORDER — MAGNESIUM SULFATE 2 GM/50ML IV SOLN
2.0000 g | Freq: Once | INTRAVENOUS | Status: AC
  Administered 2023-11-01: 2 g via INTRAVENOUS
  Filled 2023-11-01: qty 50

## 2023-11-01 MED ORDER — METOPROLOL TARTRATE 5 MG/5ML IV SOLN
5.0000 mg | Freq: Once | INTRAVENOUS | Status: AC
  Administered 2023-11-01: 5 mg via INTRAVENOUS
  Filled 2023-11-01: qty 5

## 2023-11-01 MED ORDER — METOPROLOL TARTRATE 5 MG/5ML IV SOLN
5.0000 mg | Freq: Four times a day (QID) | INTRAVENOUS | Status: AC | PRN
  Administered 2023-11-01 – 2023-11-02 (×4): 5 mg via INTRAVENOUS
  Filled 2023-11-01 (×4): qty 5

## 2023-11-01 MED ORDER — HYDRALAZINE HCL 20 MG/ML IJ SOLN
10.0000 mg | INTRAMUSCULAR | Status: DC | PRN
  Administered 2023-11-01 – 2023-11-02 (×2): 10 mg via INTRAVENOUS
  Filled 2023-11-01 (×3): qty 1

## 2023-11-01 MED ORDER — PANTOPRAZOLE SODIUM 40 MG IV SOLR
40.0000 mg | INTRAVENOUS | Status: DC
  Administered 2023-11-01 – 2023-11-04 (×4): 40 mg via INTRAVENOUS
  Filled 2023-11-01 (×5): qty 10
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
## 2023-11-01 NOTE — Progress Notes (Signed)
 Pt transported from 4N29 to CT and back without any complications.
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
## 2023-11-01 NOTE — Progress Notes (Signed)
 Called about CT and ICP sustaining in the 30s. Per nurse, patients neuro status has not changed at all. Sodium is now 144 so we are going to restart his 3% and also give him some mannitol . Notified nurse to call me if his ICPs remain elevated after both of these interventions.

## 2023-11-01 NOTE — Progress Notes (Signed)
 Contacted by nursing staff for increasing ICPs 40-50 for the last half hour, with new inability to follow commands and lack of response to pain on exam. Pupils remain unchanged. L 5mm and nonreactive, R 3mm and sluggish.   D/w Dr. Lanis. Recommend increase in rate of 3% NaCL to 100cc/hour with goal Na of 150-155 and continued Q6H serum Na levels, 50g mannitol  bolus, stat CTH. Also consider increase in sedation.   Joe Austill CAYLIN Genia Perin, PA-C

## 2023-11-01 NOTE — Progress Notes (Signed)
 Patient's ICPs increasing into the 50's. Versed  and PRN fentanyl  boluses given. Paged Neurosurgery MD.
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
## 2023-11-01 NOTE — Progress Notes (Addendum)
 This RN called Lauraine Pickle, NP regarding increased ICP in the 30s, highest ICP seen was 35. Notified of Pts neuro exam with left pupil dialated and fixed, which is unchanged and gave PRN BP meds. Provider stated she would notify Dr. Lanis and reach back out to RN. Monitoring closely. Vital signs otherwise stable. See flowsheet at 1830.  1855 NP Reccs:  No new changes. Continue hypertonics and continue to monitor for neurologic change.

## 2023-11-02 LAB — GLUCOSE, CAPILLARY
Glucose-Capillary: 125 mg/dL — ABNORMAL HIGH (ref 70–99)
Glucose-Capillary: 127 mg/dL — ABNORMAL HIGH (ref 70–99)
Glucose-Capillary: 131 mg/dL — ABNORMAL HIGH (ref 70–99)
Glucose-Capillary: 141 mg/dL — ABNORMAL HIGH (ref 70–99)
Glucose-Capillary: 146 mg/dL — ABNORMAL HIGH (ref 70–99)
Glucose-Capillary: 146 mg/dL — ABNORMAL HIGH (ref 70–99)

## 2023-11-02 LAB — CBC
HCT: 28.8 % — ABNORMAL LOW (ref 39.0–52.0)
Hemoglobin: 9.1 g/dL — ABNORMAL LOW (ref 13.0–17.0)
MCH: 28.3 pg (ref 26.0–34.0)
MCHC: 31.6 g/dL (ref 30.0–36.0)
MCV: 89.4 fL (ref 80.0–100.0)
Platelets: 175 K/uL (ref 150–400)
RBC: 3.22 MIL/uL — ABNORMAL LOW (ref 4.22–5.81)
RDW: 14.9 % (ref 11.5–15.5)
WBC: 8.1 K/uL (ref 4.0–10.5)
nRBC: 0 % (ref 0.0–0.2)

## 2023-11-02 LAB — BASIC METABOLIC PANEL WITH GFR
Anion gap: 8 (ref 5–15)
BUN: 18 mg/dL (ref 6–20)
CO2: 22 mmol/L (ref 22–32)
Calcium: 7.9 mg/dL — ABNORMAL LOW (ref 8.9–10.3)
Chloride: 119 mmol/L — ABNORMAL HIGH (ref 98–111)
Creatinine, Ser: 1.04 mg/dL (ref 0.61–1.24)
GFR, Estimated: >60 mL/min (ref >60)
Glucose, Bld: 147 mg/dL — ABNORMAL HIGH (ref 70–99)
Potassium: 3.6 mmol/L (ref 3.5–5.1)
Sodium: 149 mmol/L — ABNORMAL HIGH (ref 135–145)

## 2023-11-02 LAB — SODIUM
Sodium: 148 mmol/L — ABNORMAL HIGH (ref 135–145)
Sodium: 155 mmol/L — ABNORMAL HIGH (ref 135–145)
Sodium: 157 mmol/L — ABNORMAL HIGH (ref 135–145)

## 2023-11-02 LAB — TRIGLYCERIDES: Triglycerides: 55 mg/dL (ref <150)

## 2023-11-02 LAB — PHOSPHORUS
Phosphorus: 1.6 mg/dL — ABNORMAL LOW (ref 2.5–4.6)
Phosphorus: 2.6 mg/dL (ref 2.5–4.6)

## 2023-11-02 LAB — MAGNESIUM: Magnesium: 2.2 mg/dL (ref 1.7–2.4)

## 2023-11-02 MED ORDER — CLONAZEPAM 1 MG PO TABS
1.0000 mg | ORAL_TABLET | Freq: Three times a day (TID) | ORAL | Status: DC
  Administered 2023-11-02 – 2023-11-03 (×5): 1 mg
  Filled 2023-11-02 (×5): qty 1

## 2023-11-02 MED ORDER — POLYETHYLENE GLYCOL 3350 17 G PO PACK
17.0000 g | PACK | Freq: Two times a day (BID) | ORAL | Status: DC
  Administered 2023-11-03 – 2023-11-05 (×3): 17 g
  Filled 2023-11-02 (×4): qty 1

## 2023-11-02 MED ORDER — ALTEPLASE 2 MG IJ SOLR
2.0000 mg | Freq: Once | INTRAMUSCULAR | Status: DC
  Filled 2023-11-02: qty 2

## 2023-11-02 MED ORDER — POTASSIUM & SODIUM PHOSPHATES 280-160-250 MG PO PACK
2.0000 | PACK | ORAL | Status: AC
  Administered 2023-11-02 – 2023-11-03 (×5): 2
  Filled 2023-11-02 (×5): qty 2

## 2023-11-02 MED ORDER — SENNA 8.6 MG PO TABS
1.0000 | ORAL_TABLET | Freq: Every day | ORAL | Status: DC
  Administered 2023-11-02 – 2023-11-05 (×3): 8.6 mg
  Filled 2023-11-02 (×3): qty 1

## 2023-11-02 MED ORDER — ENOXAPARIN SODIUM 40 MG/0.4ML IJ SOSY
40.0000 mg | PREFILLED_SYRINGE | Freq: Two times a day (BID) | INTRAMUSCULAR | Status: DC
  Administered 2023-11-02 – 2023-11-08 (×13): 40 mg via SUBCUTANEOUS
  Filled 2023-11-02 (×13): qty 0.4

## 2023-11-02 MED ORDER — ESMOLOL HCL-SODIUM CHLORIDE 2000 MG/100ML IV SOLN
25.0000 ug/kg/min | INTRAVENOUS | Status: DC
  Administered 2023-11-02: 25 ug/kg/min via INTRAVENOUS
  Administered 2023-11-02 – 2023-11-03 (×2): 50 ug/kg/min via INTRAVENOUS
  Filled 2023-11-02 (×4): qty 100

## 2023-11-02 MED ORDER — FENTANYL CITRATE (PF) 2500 MCG/50ML IJ SOLN
0.0000 ug/h | Status: DC
  Administered 2023-11-02: 50 ug/h via INTRAVENOUS
  Administered 2023-11-03: 300 ug/h via INTRAVENOUS
  Administered 2023-11-03: 350 ug/h via INTRAVENOUS
  Administered 2023-11-04: 400 ug/h via INTRAVENOUS
  Filled 2023-11-02 (×5): qty 100

## 2023-11-02 MED ORDER — POTASSIUM PHOSPHATES 15 MMOLE/5ML IV SOLN
45.0000 mmol | Freq: Once | INTRAVENOUS | Status: DC
Start: 2023-11-02 — End: 2023-11-02
  Filled 2023-11-02: qty 15
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
## 2023-11-02 NOTE — Progress Notes (Signed)
 Notified Bergman, NP regarding the patients 1800 sodium result of 157. Advised to decrease the patients 3% rate from 50 to 40mL/hr. Also notified Bergman, NP that the patient is no longer flickering to pain, however he is on 300 mcg of Fentanyl .   [phone] - Paged [facility] regarding the patients 0000 sodium result of 159. Mavis, MD advised to make no additional changes at this time.
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
## 2023-11-02 NOTE — Progress Notes (Signed)
 Pt BP continuing to trend upward despite ventilator synchrony. Fentanyl  drip was titrated up per protocol in an attempt to achieve adequate analgesic effect. Pt grew very restless and agitated and ICP increased to high 30's - low 40s. Attempted a position change and 100 mcg fentanyl  bolus that was ineffective in addressing agitation/restlessness. Dr. Polly was notified of SBP trending upward despite adequate analgesic titration and an order was obtained for esmolol  that was initiated at 25mcg/kg and titrated to 50 mcg/kg per protocol. BP is now 147/97 and patient is resting in bed with chest rise/fall noted. Dr. Jennetta was on the floor during this time and was notified about increased ICP sustained in the 40's. No further orders at this time. However if ICP is sustained at 50 overnight, night RN is to notify [facility]. Will pass along in report.

## 2023-11-02 NOTE — Progress Notes (Addendum)
 52 - Paged [facility] regarding the patients increased ICP in the 40s after receiving 2 fentanyl  boluses, 2mg  of Versed , and increasing the continuous fentanyl  rate to 225.   0400 - Spoke with Vancleave, PA, ICP still 40 and there are no new orders at this time.   9554 - Patients ICP is currently 16 following two additional Fentanyl  bolus and increasing the continuous Fentanyl  dosage to 275.   0455 - Advised by Johnanna, PA to call back if the ICP were to rise and maintain above 40.
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
## 2023-11-02 NOTE — Progress Notes (Signed)
 RT NOTE: patient placed on CPAP/PSV of 10/5 at 0740.  Tolerating well at this time.  Will continue to monitor.

## 2023-11-03 ENCOUNTER — Inpatient Hospital Stay (HOSPITAL_COMMUNITY)

## 2023-11-03 LAB — BASIC METABOLIC PANEL WITH GFR
Anion gap: 14 (ref 5–15)
BUN: 27 mg/dL — ABNORMAL HIGH (ref 6–20)
CO2: 22 mmol/L (ref 22–32)
Calcium: 7.8 mg/dL — ABNORMAL LOW (ref 8.9–10.3)
Chloride: 121 mmol/L — ABNORMAL HIGH (ref 98–111)
Creatinine, Ser: 1 mg/dL (ref 0.61–1.24)
GFR, Estimated: >60 mL/min (ref >60)
Glucose, Bld: 156 mg/dL — ABNORMAL HIGH (ref 70–99)
Potassium: 3.6 mmol/L (ref 3.5–5.1)
Sodium: 157 mmol/L — ABNORMAL HIGH (ref 135–145)

## 2023-11-03 LAB — PHOSPHORUS: Phosphorus: 1.9 mg/dL — ABNORMAL LOW (ref 2.5–4.6)

## 2023-11-03 LAB — GLUCOSE, CAPILLARY
Glucose-Capillary: 143 mg/dL — ABNORMAL HIGH (ref 70–99)
Glucose-Capillary: 134 mg/dL — ABNORMAL HIGH (ref 70–99)
Glucose-Capillary: 141 mg/dL — ABNORMAL HIGH (ref 70–99)
Glucose-Capillary: 172 mg/dL — ABNORMAL HIGH (ref 70–99)
Glucose-Capillary: 152 mg/dL — ABNORMAL HIGH (ref 70–99)

## 2023-11-03 LAB — MAGNESIUM: Magnesium: 2.3 mg/dL (ref 1.7–2.4)

## 2023-11-03 LAB — CBC
HCT: 27.7 % — ABNORMAL LOW (ref 39.0–52.0)
Hemoglobin: 8.7 g/dL — ABNORMAL LOW (ref 13.0–17.0)
MCH: 28.2 pg (ref 26.0–34.0)
MCHC: 31.4 g/dL (ref 30.0–36.0)
MCV: 89.9 fL (ref 80.0–100.0)
Platelets: 188 K/uL (ref 150–400)
RBC: 3.08 MIL/uL — ABNORMAL LOW (ref 4.22–5.81)
RDW: 15.3 % (ref 11.5–15.5)
WBC: 7.6 K/uL (ref 4.0–10.5)
nRBC: 0 % (ref 0.0–0.2)

## 2023-11-03 LAB — SODIUM
Sodium: 156 mmol/L — ABNORMAL HIGH (ref 135–145)
Sodium: 157 mmol/L — ABNORMAL HIGH (ref 135–145)
Sodium: 159 mmol/L — ABNORMAL HIGH (ref 135–145)

## 2023-11-03 MED ORDER — ROCURONIUM BROMIDE 10 MG/ML (PF) SYRINGE
100.0000 mg | PREFILLED_SYRINGE | Freq: Once | INTRAVENOUS | Status: AC
  Administered 2023-11-03: 100 mg via INTRAVENOUS

## 2023-11-03 MED ORDER — METOPROLOL TARTRATE 5 MG/5ML IV SOLN: 5.0000 mg | Freq: Once | INTRAVENOUS | Status: DC

## 2023-11-03 MED ORDER — POTASSIUM & SODIUM PHOSPHATES 280-160-250 MG PO PACK
2.0000 | PACK | ORAL | Status: AC
  Administered 2023-11-03 – 2023-11-04 (×4): 2
  Filled 2023-11-03 (×4): qty 2

## 2023-11-03 MED ORDER — METOPROLOL TARTRATE 5 MG/5ML IV SOLN
INTRAVENOUS | Status: AC
  Filled 2023-11-03: qty 5

## 2023-11-03 MED ORDER — LOSARTAN POTASSIUM 50 MG PO TABS
50.0000 mg | ORAL_TABLET | Freq: Every day | ORAL | Status: DC
Start: 2023-11-03 — End: 2023-11-03
  Administered 2023-11-03: 50 mg
  Filled 2023-11-03: qty 1

## 2023-11-03 MED ORDER — SODIUM CHLORIDE 0.9 % IV SOLN
2.0000 g | Freq: Three times a day (TID) | INTRAVENOUS | Status: DC
  Administered 2023-11-03 – 2023-11-08 (×18): 2 g via INTRAVENOUS
  Filled 2023-11-03 (×18): qty 12.5

## 2023-11-03 MED ORDER — PROPOFOL 1000 MG/100ML IV EMUL: 0.0000 ug/kg/min | INTRAVENOUS | Status: DC

## 2023-11-03 MED ORDER — NOREPINEPHRINE 4 MG/250ML-% IV SOLN
0.0000 ug/min | INTRAVENOUS | Status: DC
  Administered 2023-11-03: 5 ug/min via INTRAVENOUS
  Administered 2023-11-04: 2 ug/min via INTRAVENOUS
  Filled 2023-11-03 (×2): qty 250

## 2023-11-03 MED ORDER — ROCURONIUM BROMIDE 10 MG/ML (PF) SYRINGE
PREFILLED_SYRINGE | INTRAVENOUS | Status: AC
  Filled 2023-11-03: qty 10

## 2023-11-03 MED ORDER — ALBUMIN HUMAN 25 % IV SOLN
INTRAVENOUS | Status: AC
  Filled 2023-11-03: qty 50

## 2023-11-03 MED ORDER — QUETIAPINE FUMARATE 25 MG PO TABS
50.0000 mg | ORAL_TABLET | Freq: Two times a day (BID) | ORAL | Status: DC
Start: 2023-11-03 — End: 2023-11-04
  Administered 2023-11-03 (×2): 50 mg
  Filled 2023-11-03 (×2): qty 2

## 2023-11-03 MED ORDER — ALBUMIN HUMAN 5 % IV SOLN
12.5000 g | Freq: Once | INTRAVENOUS | Status: AC
  Administered 2023-11-03: 12.5 g via INTRAVENOUS
  Filled 2023-11-03: qty 250

## 2023-11-03 MED ORDER — NICARDIPINE HCL IN NACL 20-0.86 MG/200ML-% IV SOLN
3.0000 mg/h | INTRAVENOUS | Status: DC
  Administered 2023-11-03: 5 mg/h via INTRAVENOUS
  Filled 2023-11-03: qty 200

## 2023-11-03 MED ORDER — IPRATROPIUM-ALBUTEROL 0.5-2.5 (3) MG/3ML IN SOLN
3.0000 mL | Freq: Four times a day (QID) | RESPIRATORY_TRACT | Status: DC
  Administered 2023-11-03 – 2023-11-04 (×4): 3 mL via RESPIRATORY_TRACT
  Filled 2023-11-03 (×4): qty 3

## 2023-11-03 NOTE — Progress Notes (Signed)
 RT transported pt from 4N29 to CT-1 and back with RN x2 at bedside. No complications at this time.
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
## 2023-11-03 NOTE — Progress Notes (Signed)
 Patient ID: JENESIS SUCHY, male   DOB: Nov 04, 1980, 43 y.o.   MRN: [id number] Patient had decreased saturations and seemed to be fighting against the vent.  I did a recruitment maneuver and turned his PEEP up to 10.  I did not a one-time dose of rocuronium .  He became more tachycardic and hypotensive.  Albumin  bolus.  Will add Levophed  as needed to preserve MAP of 65+.  Will add bronchodilators.  Dann Hummer, MD, MPH, FACS Please use [email or web address] to contact on call provider

## 2023-11-04 ENCOUNTER — Inpatient Hospital Stay (HOSPITAL_COMMUNITY)

## 2023-11-04 DIAGNOSIS — J189 Pneumonia, unspecified organism: Secondary | ICD-10-CM

## 2023-11-04 DIAGNOSIS — T07XXXA Unspecified multiple injuries, initial encounter: Secondary | ICD-10-CM

## 2023-11-04 DIAGNOSIS — R578 Other shock: Secondary | ICD-10-CM | POA: Diagnosis not present

## 2023-11-04 DIAGNOSIS — I619 Nontraumatic intracerebral hemorrhage, unspecified: Secondary | ICD-10-CM

## 2023-11-04 DIAGNOSIS — J9601 Acute respiratory failure with hypoxia: Secondary | ICD-10-CM

## 2023-11-04 LAB — POCT I-STAT 7, (LYTES, BLD GAS, ICA,H+H)
Acid-Base Excess: 0 mmol/L (ref 0.0–2.0)
Acid-base deficit: 3 mmol/L — ABNORMAL HIGH (ref 0.0–2.0)
Acid-base deficit: 5 mmol/L — ABNORMAL HIGH (ref 0.0–2.0)
Bicarbonate: 24.2 mmol/L (ref 20.0–28.0)
Bicarbonate: 25.4 mmol/L (ref 20.0–28.0)
Bicarbonate: 24.1 mmol/L (ref 20.0–28.0)
Calcium, Ion: 1.12 mmol/L — ABNORMAL LOW (ref 1.15–1.40)
Calcium, Ion: 1.17 mmol/L (ref 1.15–1.40)
Calcium, Ion: 1.22 mmol/L (ref 1.15–1.40)
HCT: 23 % — ABNORMAL LOW (ref 39.0–52.0)
HCT: 26 % — ABNORMAL LOW (ref 39.0–52.0)
HCT: 27 % — ABNORMAL LOW (ref 39.0–52.0)
Hemoglobin: 7.8 g/dL — ABNORMAL LOW (ref 13.0–17.0)
Hemoglobin: 8.8 g/dL — ABNORMAL LOW (ref 13.0–17.0)
Hemoglobin: 9.2 g/dL — ABNORMAL LOW (ref 13.0–17.0)
O2 Saturation: 100 %
O2 Saturation: 98 %
O2 Saturation: 98 %
Patient temperature: 99.3
Patient temperature: 101.1
Patient temperature: 99.3
Potassium: 3.7 mmol/L (ref 3.5–5.1)
Potassium: 4.7 mmol/L (ref 3.5–5.1)
Potassium: 4.6 mmol/L (ref 3.5–5.1)
Sodium: 162 mmol/L (ref 135–145)
Sodium: 158 mmol/L — ABNORMAL HIGH (ref 135–145)
Sodium: 161 mmol/L (ref 135–145)
TCO2: 25 mmol/L (ref 22–32)
TCO2: 27 mmol/L (ref 22–32)
TCO2: 26 mmol/L (ref 22–32)
pCO2 arterial: 34.3 mmHg (ref 32–48)
pCO2 arterial: 68.6 mmHg (ref 32–48)
pCO2 arterial: 66.7 mmHg (ref 32–48)
pH, Arterial: 7.458 — ABNORMAL HIGH (ref 7.35–7.45)
pH, Arterial: 7.184 — CL (ref 7.35–7.45)
pH, Arterial: 7.168 — CL (ref 7.35–7.45)
pO2, Arterial: 172 mmHg — ABNORMAL HIGH (ref 83–108)
pO2, Arterial: 132 mmHg — ABNORMAL HIGH (ref 83–108)
pO2, Arterial: 147 mmHg — ABNORMAL HIGH (ref 83–108)

## 2023-11-04 LAB — CBC
HCT: 29.2 % — ABNORMAL LOW (ref 39.0–52.0)
Hemoglobin: 9 g/dL — ABNORMAL LOW (ref 13.0–17.0)
MCH: 28.2 pg (ref 26.0–34.0)
MCHC: 30.8 g/dL (ref 30.0–36.0)
MCV: 91.5 fL (ref 80.0–100.0)
Platelets: 215 K/uL (ref 150–400)
RBC: 3.19 MIL/uL — ABNORMAL LOW (ref 4.22–5.81)
RDW: 15.8 % — ABNORMAL HIGH (ref 11.5–15.5)
WBC: 7.7 K/uL (ref 4.0–10.5)
nRBC: 0.3 % — ABNORMAL HIGH (ref 0.0–0.2)

## 2023-11-04 LAB — GLUCOSE, CAPILLARY
Glucose-Capillary: 138 mg/dL — ABNORMAL HIGH (ref 70–99)
Glucose-Capillary: 151 mg/dL — ABNORMAL HIGH (ref 70–99)
Glucose-Capillary: 159 mg/dL — ABNORMAL HIGH (ref 70–99)
Glucose-Capillary: 176 mg/dL — ABNORMAL HIGH (ref 70–99)
Glucose-Capillary: 180 mg/dL — ABNORMAL HIGH (ref 70–99)
Glucose-Capillary: 188 mg/dL — ABNORMAL HIGH (ref 70–99)
Glucose-Capillary: 246 mg/dL — ABNORMAL HIGH (ref 70–99)

## 2023-11-04 LAB — COOXEMETRY PANEL
Carboxyhemoglobin: 0.3 % — ABNORMAL LOW (ref 0.5–1.5)
Methemoglobin: 2.1 % — ABNORMAL HIGH (ref 0.0–1.5)
O2 Saturation: 98.7 %
Total hemoglobin: 9.8 g/dL — ABNORMAL LOW (ref 12.0–16.0)

## 2023-11-04 LAB — BASIC METABOLIC PANEL WITH GFR
Anion gap: 15 (ref 5–15)
Anion gap: 10 (ref 5–15)
BUN: 36 mg/dL — ABNORMAL HIGH (ref 6–20)
BUN: 39 mg/dL — ABNORMAL HIGH (ref 6–20)
CO2: 24 mmol/L (ref 22–32)
CO2: 25 mmol/L (ref 22–32)
Calcium: 7.5 mg/dL — ABNORMAL LOW (ref 8.9–10.3)
Calcium: 7.6 mg/dL — ABNORMAL LOW (ref 8.9–10.3)
Chloride: 124 mmol/L — ABNORMAL HIGH (ref 98–111)
Chloride: 124 mmol/L — ABNORMAL HIGH (ref 98–111)
Creatinine, Ser: 1.28 mg/dL — ABNORMAL HIGH (ref 0.61–1.24)
Creatinine, Ser: 1.58 mg/dL — ABNORMAL HIGH (ref 0.61–1.24)
GFR, Estimated: >60 mL/min (ref >60)
GFR, Estimated: 55 mL/min — ABNORMAL LOW (ref >60)
Glucose, Bld: 180 mg/dL — ABNORMAL HIGH (ref 70–99)
Glucose, Bld: 202 mg/dL — ABNORMAL HIGH (ref 70–99)
Potassium: 3.7 mmol/L (ref 3.5–5.1)
Potassium: 4.7 mmol/L (ref 3.5–5.1)
Sodium: 163 mmol/L (ref 135–145)
Sodium: 159 mmol/L — ABNORMAL HIGH (ref 135–145)

## 2023-11-04 LAB — ECHOCARDIOGRAM COMPLETE
Area-P 1/2: 4.96 cm2
Calc EF: 54.9 %
Height: 70 in
S' Lateral: 2.4 cm
Single Plane A2C EF: 58.6 %
Single Plane A4C EF: 58.6 %
Weight: 3365.1 [oz_av]

## 2023-11-04 LAB — SODIUM: Sodium: 156 mmol/L — ABNORMAL HIGH (ref 135–145)

## 2023-11-04 LAB — TRIGLYCERIDES: Triglycerides: 127 mg/dL (ref <150)

## 2023-11-04 MED ORDER — BETHANECHOL CHLORIDE 25 MG PO TABS
25.0000 mg | ORAL_TABLET | Freq: Three times a day (TID) | ORAL | Status: DC
  Administered 2023-11-04 – 2023-11-08 (×15): 25 mg
  Filled 2023-11-04 (×15): qty 1

## 2023-11-04 MED ORDER — MIDAZOLAM BOLUS VIA INFUSION: 1.0000 mg | INTRAVENOUS | Status: DC | PRN

## 2023-11-04 MED ORDER — FREE WATER
200.0000 mL | Freq: Four times a day (QID) | Status: DC
  Administered 2023-11-04 – 2023-11-05 (×5): 200 mL

## 2023-11-04 MED ORDER — SODIUM CHLORIDE 0.9 % IV SOLN: INTRAVENOUS | Status: AC | PRN

## 2023-11-04 MED ORDER — SODIUM CHLORIDE 0.9 % IV SOLN
0.0000 ug/kg/min | INTRAVENOUS | Status: DC
  Administered 2023-11-04: 3 ug/kg/min via INTRAVENOUS
  Administered 2023-11-05: 0.5 ug/kg/min via INTRAVENOUS
  Filled 2023-11-04 (×2): qty 20

## 2023-11-04 MED ORDER — FENTANYL BOLUS VIA INFUSION: 50.0000 ug | INTRAVENOUS | Status: DC | PRN

## 2023-11-04 MED ORDER — CISATRACURIUM BOLUS VIA INFUSION
5.0000 mg | Freq: Once | INTRAVENOUS | Status: AC
  Administered 2023-11-04: 5 mg via INTRAVENOUS
  Filled 2023-11-04: qty 5

## 2023-11-04 MED ORDER — QUETIAPINE FUMARATE 100 MG PO TABS
100.0000 mg | ORAL_TABLET | Freq: Two times a day (BID) | ORAL | Status: DC
  Administered 2023-11-04 – 2023-11-07 (×7): 100 mg
  Filled 2023-11-04 (×5): qty 1
  Filled 2023-11-04: qty 4
  Filled 2023-11-04: qty 1

## 2023-11-04 MED ORDER — VANCOMYCIN HCL 2000 MG/400ML IV SOLN
2000.0000 mg | Freq: Once | INTRAVENOUS | Status: AC
  Administered 2023-11-04: 2000 mg via INTRAVENOUS
  Filled 2023-11-04: qty 400

## 2023-11-04 MED ORDER — VASOPRESSIN 20 UNITS/100 ML INFUSION FOR SHOCK
0.0000 [IU]/min | INTRAVENOUS | Status: DC
Start: 2023-11-04 — End: 2023-11-09
  Administered 2023-11-04: 0.03 [IU]/min via INTRAVENOUS
  Administered 2023-11-04 – 2023-11-05 (×2): 0.04 [IU]/min via INTRAVENOUS
  Administered 2023-11-05: 0.03 [IU]/min via INTRAVENOUS
  Administered 2023-11-05 – 2023-11-08 (×9): 0.04 [IU]/min via INTRAVENOUS
  Filled 2023-11-04 (×14): qty 100

## 2023-11-04 MED ORDER — ALBUMIN HUMAN 5 % IV SOLN
INTRAVENOUS | Status: AC
  Filled 2023-11-04: qty 250

## 2023-11-04 MED ORDER — FENTANYL CITRATE PF 50 MCG/ML IJ SOSY: 50.0000 ug | PREFILLED_SYRINGE | Freq: Once | INTRAMUSCULAR | Status: DC

## 2023-11-04 MED ORDER — MIDAZOLAM-SODIUM CHLORIDE 100-0.9 MG/100ML-% IV SOLN: 2.0000 mg/h | INTRAVENOUS | Status: DC

## 2023-11-04 MED ORDER — CLONAZEPAM 1 MG PO TABS
2.0000 mg | ORAL_TABLET | Freq: Three times a day (TID) | ORAL | Status: DC
  Administered 2023-11-04 – 2023-11-07 (×10): 2 mg
  Filled 2023-11-04 (×10): qty 2

## 2023-11-04 MED ORDER — NOREPINEPHRINE 16 MG/250ML-% IV SOLN
0.0000 ug/min | INTRAVENOUS | Status: DC
  Administered 2023-11-04: 40 ug/min via INTRAVENOUS
  Administered 2023-11-05: 25 ug/min via INTRAVENOUS
  Administered 2023-11-05: 75 ug/min via INTRAVENOUS
  Administered 2023-11-06: 50 ug/min via INTRAVENOUS
  Administered 2023-11-06: 25 ug/min via INTRAVENOUS
  Administered 2023-11-07: 9 ug/min via INTRAVENOUS
  Filled 2023-11-04 (×8): qty 250

## 2023-11-04 MED ORDER — VECURONIUM BROMIDE 10 MG IV SOLR
10.0000 mg | Freq: Once | INTRAVENOUS | Status: AC
  Administered 2023-11-04: 10 mg via INTRAVENOUS

## 2023-11-04 MED ORDER — POTASSIUM CHLORIDE 20 MEQ PO PACK
40.0000 meq | PACK | Freq: Once | ORAL | Status: AC
  Administered 2023-11-04: 40 meq
  Filled 2023-11-04: qty 2

## 2023-11-04 MED ORDER — VECURONIUM BROMIDE 10 MG IV SOLR
INTRAVENOUS | Status: AC
  Filled 2023-11-04: qty 10

## 2023-11-04 MED ORDER — IPRATROPIUM-ALBUTEROL 0.5-2.5 (3) MG/3ML IN SOLN
3.0000 mL | RESPIRATORY_TRACT | Status: DC
  Administered 2023-11-04 – 2023-11-09 (×30): 3 mL via RESPIRATORY_TRACT
  Filled 2023-11-04 (×30): qty 3

## 2023-11-04 MED ORDER — ARTIFICIAL TEARS OPHTHALMIC OINT
1.0000 | TOPICAL_OINTMENT | Freq: Three times a day (TID) | OPHTHALMIC | Status: DC
  Administered 2023-11-04 – 2023-11-09 (×16): 1 via OPHTHALMIC
  Filled 2023-11-04 (×2): qty 3.5

## 2023-11-04 MED ORDER — ALBUMIN HUMAN 5 % IV SOLN
12.5000 g | Freq: Once | INTRAVENOUS | Status: AC
  Administered 2023-11-04: 12.5 g via INTRAVENOUS

## 2023-11-04 MED ORDER — FENTANYL CITRATE (PF) 2500 MCG/50ML IJ SOLN
0.0000 ug/h | Status: DC
  Administered 2023-11-04 – 2023-11-05 (×4): 400 ug/h via INTRAVENOUS
  Filled 2023-11-04 (×3): qty 100

## 2023-11-04 MED ORDER — INSULIN ASPART 100 UNIT/ML IJ SOLN
0.0000 [IU] | INTRAMUSCULAR | Status: DC
  Administered 2023-11-04 (×3): 3 [IU] via SUBCUTANEOUS
  Administered 2023-11-04: 5 [IU] via SUBCUTANEOUS
  Administered 2023-11-05 (×2): 3 [IU] via SUBCUTANEOUS
  Administered 2023-11-05: 5 [IU] via SUBCUTANEOUS
  Administered 2023-11-05 – 2023-11-06 (×4): 3 [IU] via SUBCUTANEOUS
  Administered 2023-11-06: 11 [IU] via SUBCUTANEOUS
  Administered 2023-11-06: 8 [IU] via SUBCUTANEOUS
  Administered 2023-11-06: 5 [IU] via SUBCUTANEOUS
  Administered 2023-11-06 (×2): 8 [IU] via SUBCUTANEOUS
  Administered 2023-11-07 (×2): 11 [IU] via SUBCUTANEOUS

## 2023-11-04 MED ORDER — VANCOMYCIN HCL 1250 MG/250ML IV SOLN
1250.0000 mg | Freq: Two times a day (BID) | INTRAVENOUS | Status: DC
  Administered 2023-11-05 – 2023-11-09 (×10): 1250 mg via INTRAVENOUS
  Filled 2023-11-04 (×13): qty 250

## 2023-11-04 MED ORDER — SODIUM CHLORIDE 0.9 % IV SOLN: INTRAVENOUS | Status: DC | PRN

## 2023-11-04 NOTE — Progress Notes (Signed)
 Patient ID: Joe Keith, male   DOB: 1980-03-28, 43 y.o.   MRN: [id number] Good ABG this AM but became dyssynchronous on the vent again. Vec x 1 given and this dropped his BP significantly. Levo + albumin  x 1. Now on 100% and PEEP 12. Sats 89. Now considering slow paralytic vs proning. I will consult CCM for assistance in management.  Dann Hummer, MD, MPH, FACS Please use [email or web address] to contact on call provider

## 2023-11-04 NOTE — Progress Notes (Addendum)
 Pharmacy Antibiotic Note  Joe Keith is a 43 y.o. male admitted on 10/29/2023 with pneumonia.  Pharmacy has been consulted for vancomycin  dosing.  Plan: Give vancomycin  2000 mg IV LD.  Then will start Vancomycin  1250 IV every 12 hours. eAUC 547  Height: 5' 10 (177.8 cm) Weight: 95.4 kg (210 lb 5.1 oz) IBW/kg (Calculated) : 73  Temp (24hrs), Avg:100.7 F (38.2 C), Min:97.7 F (36.5 C), Max:102.1 F (38.9 C)  Recent Labs  Lab 10/29/23 1842 10/30/23 0220 11/01/23 0400 11/01/23 1233 11/02/23 0557 11/03/23 0546 11/04/23 0529  WBC  --    < > 11.6* 14.9* 8.1 7.6 7.7  CREATININE 1.30*   < > 0.96 1.06 1.04 1.00 1.28*  LATICACIDVEN 1.2  --   --   --   --   --   --    < > = values in this interval not displayed.    Estimated Creatinine Clearance: 84.5 mL/min (A) (by C-G formula based on SCr of 1.28 mg/dL (H)).    Allergies  Allergen Reactions   Codeine Hives and Nausea Only    Antimicrobials this admission: Cefepime  9/18 >> 9/19  Microbiology results: 9/18 Sputum: enterobacter, staph aureus (susceptibilities pending)   9/13 MRSA PCR: negative 9/19 MRSA PCR: (repeat) pending   Thank you for allowing pharmacy to be a part of this patient's care.  Joe Keith 11/04/2023 3:20 PM

## 2023-11-04 NOTE — Progress Notes (Signed)
 Date and time results received: 11/04/23 0628 (use smartphrase .now to insert current time)  Test: Sodium Critical Value: 163  Name of Provider Notified: Leonor Dawn MD  Orders Received? Or Actions Taken?: Hypertonic stopped per protocol

## 2023-11-04 NOTE — Progress Notes (Signed)
 Nimbex  off given varying TOF at two different sites, BIS < 60. Triggering on vent, some asynchrony.  Difficult to interpret effectiveness of measures with ABGs.  Discussed with attending, resume Nimbex  despite TOF, continue to assess TOF but nimbex  now for vent triggering/ asynchrony.       Lyle Pesa, NP [facility] 11/04/2023, 6:29 PM  See Amion for pager If no response to pager , please call [phone] until 7pm After 7:00 pm call Elink  [phone]
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
## 2023-11-04 NOTE — Progress Notes (Signed)
 Patient ID: Joe Keith, male   DOB: 02-20-1980, 43 y.o.   MRN: [id number] Appreciate CCM care including possible trial of R side down ventilation. I updated his daughter on the phone regarding his deterioration today and all the efforts we are doing to try to improve things. ECHO just done. RT will be placing an art line now and we will get F/U ABG.  Dann Hummer, MD, MPH, FACS Please use [email or web address] to contact on call provider
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
## 2023-11-04 NOTE — Progress Notes (Signed)
 Patient ID: Joe Keith, male   DOB: 04-06-1980, 43 y.o.   MRN: [id number] I updated other family members at the bedside.  Dann Hummer, MD, MPH, FACS Please use [email or web address] to contact on call provider

## 2023-11-04 NOTE — Progress Notes (Signed)
  Echocardiogram 2D Echocardiogram has been performed.  Norleen ORN Paddy Neis 11/04/2023, 1:20 PM
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
## 2023-11-04 NOTE — Procedures (Signed)
 Arterial Line Insertion Start/End9/19/2025 12:54 PM, 11/04/2023 1:32 PM  Patient location: ICU. Preanesthetic checklist: patient identified, IV checked, site marked, risks and benefits discussed, surgical consent, monitors and equipment checked, pre-op evaluation and timeout performed Right, radial was placed Catheter size: 20 G Hand hygiene performed  and maximum sterile barriers used  Allen's test indicative of satisfactory collateral circulation Attempts: 2 Procedure performed without using ultrasound guided technique. Following insertion, dressing applied and Biopatch. Patient tolerated the procedure well with no immediate complications.

## 2023-11-05 ENCOUNTER — Inpatient Hospital Stay (HOSPITAL_COMMUNITY)

## 2023-11-05 DIAGNOSIS — I619 Nontraumatic intracerebral hemorrhage, unspecified: Secondary | ICD-10-CM | POA: Diagnosis not present

## 2023-11-05 DIAGNOSIS — T07XXXA Unspecified multiple injuries, initial encounter: Secondary | ICD-10-CM | POA: Diagnosis not present

## 2023-11-05 DIAGNOSIS — J189 Pneumonia, unspecified organism: Secondary | ICD-10-CM | POA: Diagnosis not present

## 2023-11-05 DIAGNOSIS — J9601 Acute respiratory failure with hypoxia: Secondary | ICD-10-CM | POA: Diagnosis not present

## 2023-11-05 LAB — SODIUM
Sodium: 147 mmol/L — ABNORMAL HIGH (ref 135–145)
Sodium: 148 mmol/L — ABNORMAL HIGH (ref 135–145)
Sodium: 151 mmol/L — ABNORMAL HIGH (ref 135–145)
Sodium: 154 mmol/L — ABNORMAL HIGH (ref 135–145)

## 2023-11-05 LAB — GLUCOSE, CAPILLARY
Glucose-Capillary: 154 mg/dL — ABNORMAL HIGH (ref 70–99)
Glucose-Capillary: 154 mg/dL — ABNORMAL HIGH (ref 70–99)
Glucose-Capillary: 166 mg/dL — ABNORMAL HIGH (ref 70–99)
Glucose-Capillary: 206 mg/dL — ABNORMAL HIGH (ref 70–99)
Glucose-Capillary: 160 mg/dL — ABNORMAL HIGH (ref 70–99)

## 2023-11-05 LAB — BASIC METABOLIC PANEL WITH GFR
Anion gap: 10 (ref 5–15)
BUN: 34 mg/dL — ABNORMAL HIGH (ref 6–20)
CO2: 24 mmol/L (ref 22–32)
Calcium: 7.8 mg/dL — ABNORMAL LOW (ref 8.9–10.3)
Chloride: 117 mmol/L — ABNORMAL HIGH (ref 98–111)
Creatinine, Ser: 1.29 mg/dL — ABNORMAL HIGH (ref 0.61–1.24)
GFR, Estimated: >60 mL/min (ref >60)
Glucose, Bld: 184 mg/dL — ABNORMAL HIGH (ref 70–99)
Potassium: 4.5 mmol/L (ref 3.5–5.1)
Sodium: 151 mmol/L — ABNORMAL HIGH (ref 135–145)

## 2023-11-05 LAB — POCT I-STAT 7, (LYTES, BLD GAS, ICA,H+H)
Acid-Base Excess: 1 mmol/L (ref 0.0–2.0)
Bicarbonate: 27.4 mmol/L (ref 20.0–28.0)
Calcium, Ion: 1.22 mmol/L (ref 1.15–1.40)
HCT: 23 % — ABNORMAL LOW (ref 39.0–52.0)
Hemoglobin: 7.8 g/dL — ABNORMAL LOW (ref 13.0–17.0)
O2 Saturation: 100 %
Patient temperature: 99
Potassium: 4.2 mmol/L (ref 3.5–5.1)
Sodium: 152 mmol/L — ABNORMAL HIGH (ref 135–145)
TCO2: 29 mmol/L (ref 22–32)
pCO2 arterial: 57.1 mmHg — ABNORMAL HIGH (ref 32–48)
pH, Arterial: 7.29 — ABNORMAL LOW (ref 7.35–7.45)
pO2, Arterial: 241 mmHg — ABNORMAL HIGH (ref 83–108)

## 2023-11-05 LAB — CBC
HCT: 27.6 % — ABNORMAL LOW (ref 39.0–52.0)
Hemoglobin: 8.3 g/dL — ABNORMAL LOW (ref 13.0–17.0)
MCH: 28.2 pg (ref 26.0–34.0)
MCHC: 30.1 g/dL (ref 30.0–36.0)
MCV: 93.9 fL (ref 80.0–100.0)
Platelets: 203 K/uL (ref 150–400)
RBC: 2.94 MIL/uL — ABNORMAL LOW (ref 4.22–5.81)
RDW: 16.5 % — ABNORMAL HIGH (ref 11.5–15.5)
WBC: 9.8 K/uL (ref 4.0–10.5)
nRBC: 0 % (ref 0.0–0.2)

## 2023-11-05 LAB — CULTURE, RESPIRATORY W GRAM STAIN

## 2023-11-05 LAB — HEMOGLOBIN AND HEMATOCRIT, BLOOD
HCT: 27.4 % — ABNORMAL LOW (ref 39.0–52.0)
Hemoglobin: 8.2 g/dL — ABNORMAL LOW (ref 13.0–17.0)

## 2023-11-05 MED ORDER — ALBUMIN HUMAN 25 % IV SOLN
12.5000 g | Freq: Once | INTRAVENOUS | Status: AC
Start: 2023-11-05 — End: 2023-11-06
  Administered 2023-11-05: 12.5 g via INTRAVENOUS
  Filled 2023-11-05: qty 50

## 2023-11-05 MED ORDER — MIDAZOLAM HCL 2 MG/2ML IJ SOLN
4.0000 mg | Freq: Once | INTRAMUSCULAR | Status: AC
  Administered 2023-11-05: 4 mg via INTRAVENOUS

## 2023-11-05 MED ORDER — FREE WATER
100.0000 mL | Freq: Three times a day (TID) | Status: DC
Start: 2023-11-05 — End: 2023-11-10
  Administered 2023-11-05 – 2023-11-09 (×14): 100 mL

## 2023-11-05 MED ORDER — MIDAZOLAM HCL 2 MG/2ML IJ SOLN
INTRAMUSCULAR | Status: AC
  Filled 2023-11-05: qty 4

## 2023-11-05 MED ORDER — PANTOPRAZOLE SODIUM 40 MG IV SOLR
40.0000 mg | INTRAVENOUS | Status: DC
  Administered 2023-11-05 – 2023-11-09 (×5): 40 mg via INTRAVENOUS
  Filled 2023-11-05 (×4): qty 10

## 2023-11-05 MED ORDER — FREE WATER: 100.0000 mL | Freq: Four times a day (QID) | Status: DC

## 2023-11-05 MED ORDER — HYDROCORTISONE SOD SUC (PF) 100 MG IJ SOLR
100.0000 mg | Freq: Two times a day (BID) | INTRAMUSCULAR | Status: DC
  Administered 2023-11-05 – 2023-11-09 (×8): 100 mg via INTRAVENOUS
  Filled 2023-11-05 (×8): qty 2

## 2023-11-05 MED ORDER — FUROSEMIDE 10 MG/ML IJ SOLN
40.0000 mg | Freq: Once | INTRAMUSCULAR | Status: AC
  Administered 2023-11-05: 40 mg via INTRAVENOUS
  Filled 2023-11-05: qty 4

## 2023-11-05 MED ORDER — CHLORHEXIDINE GLUCONATE CLOTH 2 % EX PADS
6.0000 | MEDICATED_PAD | Freq: Every day | CUTANEOUS | Status: DC
  Administered 2023-11-06 – 2023-11-08 (×4): 6 via TOPICAL

## 2023-11-05 MED ORDER — SODIUM CHLORIDE 0.9 % IV SOLN: INTRAVENOUS | Status: AC | PRN
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
## 2023-11-05 NOTE — Progress Notes (Signed)
 Ct chest reviewed. While patient is clinically improving from pulmonary perspective should he decompensate again based on imaging may benefit from proning.
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
## 2023-11-05 NOTE — Progress Notes (Signed)
 eLink Physician-Brief Progress Note Patient Name: Joe Keith DOB: [birth year] MRN: [id number]   Date of Service  11/05/2023  HPI/Events of Note  Notified of episode of vent desynchrony despite Fentanyl  300 and Ketamine  1 Nimbex  resumed with episode of hypotension requiring increase in norepinephrine   Seen adequately sedated at this time  eICU Interventions  CXR ordered  Discussed with [facility]     Intervention Category Intermediate Interventions: Respiratory distress - evaluation and management  Damien ONEIDA Grout 11/05/2023, 9:36 PM

## 2023-11-05 NOTE — Progress Notes (Signed)
 eLink Physician-Brief Progress Note Patient Name: Joe Keith DOB: [birth year] MRN: [id number]   Date of Service  11/05/2023  HPI/Events of Note  Notified that fentanyl  had to be turned off due to hypotension  On norepinephrine  at 70, vasopressin  0.04 BP 130/58 HR 115  CXR improved on right, slightly worse on left Fluid negative past 24 hours but still overall positive  eICU Interventions  Start stress dose steroids Give a trial of albumin  Discussed with [facility]     Intervention Category Intermediate Interventions: Hypotension - evaluation and management  Damien ONEIDA Grout 11/05/2023, 10:47 PM

## 2023-11-05 NOTE — Progress Notes (Signed)
Patient transported to CT and back without complication. 

## 2023-11-05 NOTE — Procedures (Signed)
 Arterial Line Insertion Start/End9/20/2025 2:32 PM, 11/05/2023 2:38 PM  Patient location: ICU. Preanesthetic checklist: patient identified, site marked, risks and benefits discussed and surgical consent Left, radial was placed Catheter size: 20 G Hand hygiene performed  and maximum sterile barriers used  Allen's test indicative of satisfactory collateral circulation Attempts: 2 Procedure performed without using ultrasound guided technique. Following insertion, dressing applied and Biopatch. Post procedure assessment: normal  Patient tolerated the procedure well with no immediate complications.
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
## 2023-11-06 ENCOUNTER — Inpatient Hospital Stay (HOSPITAL_COMMUNITY)

## 2023-11-06 DIAGNOSIS — J80 Acute respiratory distress syndrome: Secondary | ICD-10-CM

## 2023-11-06 DIAGNOSIS — G9382 Brain death: Secondary | ICD-10-CM | POA: Diagnosis not present

## 2023-11-06 DIAGNOSIS — J15211 Pneumonia due to Methicillin susceptible Staphylococcus aureus: Secondary | ICD-10-CM

## 2023-11-06 DIAGNOSIS — S27321A Contusion of lung, unilateral, initial encounter: Secondary | ICD-10-CM | POA: Diagnosis not present

## 2023-11-06 DIAGNOSIS — I619 Nontraumatic intracerebral hemorrhage, unspecified: Principal | ICD-10-CM

## 2023-11-06 DIAGNOSIS — J1569 Pneumonia due to other gram-negative bacteria: Secondary | ICD-10-CM | POA: Diagnosis not present

## 2023-11-06 DIAGNOSIS — S06A1XA Traumatic brain compression with herniation, initial encounter: Secondary | ICD-10-CM | POA: Diagnosis not present

## 2023-11-06 DIAGNOSIS — Z23 Encounter for immunization: Secondary | ICD-10-CM | POA: Diagnosis not present

## 2023-11-06 DIAGNOSIS — S2242XA Multiple fractures of ribs, left side, initial encounter for closed fracture: Secondary | ICD-10-CM | POA: Diagnosis not present

## 2023-11-06 DIAGNOSIS — R402433 Glasgow coma scale score 3-8, at hospital admission: Secondary | ICD-10-CM | POA: Diagnosis not present

## 2023-11-06 DIAGNOSIS — S22049A Unspecified fracture of fourth thoracic vertebra, initial encounter for closed fracture: Secondary | ICD-10-CM | POA: Diagnosis not present

## 2023-11-06 DIAGNOSIS — G08 Intracranial and intraspinal phlebitis and thrombophlebitis: Secondary | ICD-10-CM | POA: Diagnosis not present

## 2023-11-06 LAB — GLUCOSE, CAPILLARY
Glucose-Capillary: 169 mg/dL — ABNORMAL HIGH (ref 70–99)
Glucose-Capillary: 188 mg/dL — ABNORMAL HIGH (ref 70–99)
Glucose-Capillary: 210 mg/dL — ABNORMAL HIGH (ref 70–99)
Glucose-Capillary: 262 mg/dL — ABNORMAL HIGH (ref 70–99)
Glucose-Capillary: 267 mg/dL — ABNORMAL HIGH (ref 70–99)
Glucose-Capillary: 282 mg/dL — ABNORMAL HIGH (ref 70–99)
Glucose-Capillary: 313 mg/dL — ABNORMAL HIGH (ref 70–99)
Glucose-Capillary: 342 mg/dL — ABNORMAL HIGH (ref 70–99)

## 2023-11-06 LAB — BASIC METABOLIC PANEL WITH GFR
Anion gap: 11 (ref 5–15)
BUN: 36 mg/dL — ABNORMAL HIGH (ref 6–20)
CO2: 25 mmol/L (ref 22–32)
Calcium: 7.9 mg/dL — ABNORMAL LOW (ref 8.9–10.3)
Chloride: 109 mmol/L (ref 98–111)
Creatinine, Ser: 1.26 mg/dL — ABNORMAL HIGH (ref 0.61–1.24)
GFR, Estimated: >60 mL/min (ref >60)
Glucose, Bld: 342 mg/dL — ABNORMAL HIGH (ref 70–99)
Potassium: 4.7 mmol/L (ref 3.5–5.1)
Sodium: 145 mmol/L (ref 135–145)

## 2023-11-06 LAB — COMPREHENSIVE METABOLIC PANEL WITH GFR
ALT: 18 U/L (ref 0–44)
ALT: 16 U/L (ref 0–44)
AST: 33 U/L (ref 15–41)
AST: 27 U/L (ref 15–41)
Albumin: 1.7 g/dL — ABNORMAL LOW (ref 3.5–5.0)
Albumin: 1.6 g/dL — ABNORMAL LOW (ref 3.5–5.0)
Alkaline Phosphatase: 93 U/L (ref 38–126)
Alkaline Phosphatase: 82 U/L (ref 38–126)
Anion gap: 12 (ref 5–15)
Anion gap: 10 (ref 5–15)
BUN: 45 mg/dL — ABNORMAL HIGH (ref 6–20)
BUN: 51 mg/dL — ABNORMAL HIGH (ref 6–20)
CO2: 25 mmol/L (ref 22–32)
CO2: 24 mmol/L (ref 22–32)
Calcium: 8.2 mg/dL — ABNORMAL LOW (ref 8.9–10.3)
Calcium: 7.8 mg/dL — ABNORMAL LOW (ref 8.9–10.3)
Chloride: 108 mmol/L (ref 98–111)
Chloride: 109 mmol/L (ref 98–111)
Creatinine, Ser: 1.43 mg/dL — ABNORMAL HIGH (ref 0.61–1.24)
Creatinine, Ser: 1.41 mg/dL — ABNORMAL HIGH (ref 0.61–1.24)
GFR, Estimated: >60 mL/min (ref >60)
GFR, Estimated: >60 mL/min (ref >60)
Glucose, Bld: 247 mg/dL — ABNORMAL HIGH (ref 70–99)
Glucose, Bld: 353 mg/dL — ABNORMAL HIGH (ref 70–99)
Potassium: 4.1 mmol/L (ref 3.5–5.1)
Potassium: 4.1 mmol/L (ref 3.5–5.1)
Sodium: 145 mmol/L (ref 135–145)
Sodium: 143 mmol/L (ref 135–145)
Total Bilirubin: 0.6 mg/dL (ref 0.0–1.2)
Total Bilirubin: 0.6 mg/dL (ref 0.0–1.2)
Total Protein: 5.9 g/dL — ABNORMAL LOW (ref 6.5–8.1)
Total Protein: 5.5 g/dL — ABNORMAL LOW (ref 6.5–8.1)

## 2023-11-06 LAB — URINALYSIS, ROUTINE W REFLEX MICROSCOPIC
Bilirubin Urine: NEGATIVE
Glucose, UA: NEGATIVE mg/dL
Ketones, ur: NEGATIVE mg/dL
Leukocytes,Ua: NEGATIVE
Nitrite: NEGATIVE
Protein, ur: 100 mg/dL — AB
Specific Gravity, Urine: 1.026 (ref 1.005–1.030)
pH: 5 (ref 5.0–8.0)

## 2023-11-06 LAB — POCT I-STAT 7, (LYTES, BLD GAS, ICA,H+H)
Acid-Base Excess: 0 mmol/L (ref 0.0–2.0)
Acid-Base Excess: 3 mmol/L — ABNORMAL HIGH (ref 0.0–2.0)
Bicarbonate: 25.9 mmol/L (ref 20.0–28.0)
Bicarbonate: 27.4 mmol/L (ref 20.0–28.0)
Calcium, Ion: 1.11 mmol/L — ABNORMAL LOW (ref 1.15–1.40)
Calcium, Ion: 1.17 mmol/L (ref 1.15–1.40)
HCT: 23 % — ABNORMAL LOW (ref 39.0–52.0)
HCT: 23 % — ABNORMAL LOW (ref 39.0–52.0)
Hemoglobin: 7.8 g/dL — ABNORMAL LOW (ref 13.0–17.0)
Hemoglobin: 7.8 g/dL — ABNORMAL LOW (ref 13.0–17.0)
O2 Saturation: 99 %
O2 Saturation: 94 %
Patient temperature: 37.8
Patient temperature: 38.8
Potassium: 4.4 mmol/L (ref 3.5–5.1)
Potassium: 4.1 mmol/L (ref 3.5–5.1)
Sodium: 145 mmol/L (ref 135–145)
Sodium: 146 mmol/L — ABNORMAL HIGH (ref 135–145)
TCO2: 27 mmol/L (ref 22–32)
TCO2: 29 mmol/L (ref 22–32)
pCO2 arterial: 50.2 mmHg — ABNORMAL HIGH (ref 32–48)
pCO2 arterial: 41.6 mmHg (ref 32–48)
pH, Arterial: 7.324 — ABNORMAL LOW (ref 7.35–7.45)
pH, Arterial: 7.434 (ref 7.35–7.45)
pO2, Arterial: 142 mmHg — ABNORMAL HIGH (ref 83–108)
pO2, Arterial: 75 mmHg — ABNORMAL LOW (ref 83–108)

## 2023-11-06 LAB — CBC
HCT: 28.4 % — ABNORMAL LOW (ref 39.0–52.0)
HCT: 24.5 % — ABNORMAL LOW (ref 39.0–52.0)
HCT: 22.2 % — ABNORMAL LOW (ref 39.0–52.0)
Hemoglobin: 8.8 g/dL — ABNORMAL LOW (ref 13.0–17.0)
Hemoglobin: 7.5 g/dL — ABNORMAL LOW (ref 13.0–17.0)
Hemoglobin: 6.8 g/dL — CL (ref 13.0–17.0)
MCH: 28.3 pg (ref 26.0–34.0)
MCH: 27.7 pg (ref 26.0–34.0)
MCH: 28.1 pg (ref 26.0–34.0)
MCHC: 31 g/dL (ref 30.0–36.0)
MCHC: 30.6 g/dL (ref 30.0–36.0)
MCHC: 30.6 g/dL (ref 30.0–36.0)
MCV: 91.3 fL (ref 80.0–100.0)
MCV: 90.4 fL (ref 80.0–100.0)
MCV: 91.7 fL (ref 80.0–100.0)
Platelets: 321 K/uL (ref 150–400)
Platelets: 265 K/uL (ref 150–400)
Platelets: 181 K/uL (ref 150–400)
RBC: 3.11 MIL/uL — ABNORMAL LOW (ref 4.22–5.81)
RBC: 2.71 MIL/uL — ABNORMAL LOW (ref 4.22–5.81)
RBC: 2.42 MIL/uL — ABNORMAL LOW (ref 4.22–5.81)
RDW: 16.2 % — ABNORMAL HIGH (ref 11.5–15.5)
RDW: 15.9 % — ABNORMAL HIGH (ref 11.5–15.5)
RDW: 15.9 % — ABNORMAL HIGH (ref 11.5–15.5)
WBC: 21.7 K/uL — ABNORMAL HIGH (ref 4.0–10.5)
WBC: 11.1 K/uL — ABNORMAL HIGH (ref 4.0–10.5)
WBC: 9.4 K/uL (ref 4.0–10.5)
nRBC: 0 % (ref 0.0–0.2)
nRBC: 0.2 % (ref 0.0–0.2)
nRBC: 0.2 % (ref 0.0–0.2)

## 2023-11-06 LAB — AMYLASE
Amylase: 12 U/L — ABNORMAL LOW (ref 28–100)
Amylase: 13 U/L — ABNORMAL LOW (ref 28–100)

## 2023-11-06 LAB — LACTIC ACID, PLASMA: Lactic Acid, Venous: 2.2 mmol/L (ref 0.5–1.9)

## 2023-11-06 LAB — PROTIME-INR
INR: 1.3 — ABNORMAL HIGH (ref 0.8–1.2)
INR: 1.2 (ref 0.8–1.2)
Prothrombin Time: 16.7 s — ABNORMAL HIGH (ref 11.4–15.2)
Prothrombin Time: 16.3 s — ABNORMAL HIGH (ref 11.4–15.2)

## 2023-11-06 LAB — CK TOTAL AND CKMB (NOT AT ARMC)
CK, MB: 12.3 ng/mL — ABNORMAL HIGH (ref 0.5–5.0)
CK, MB: 4.3 ng/mL (ref 0.5–5.0)
Total CK: 751 U/L — ABNORMAL HIGH (ref 49–397)
Total CK: 595 U/L — ABNORMAL HIGH (ref 49–397)

## 2023-11-06 LAB — BILIRUBIN, DIRECT
Bilirubin, Direct: 0.1 mg/dL (ref 0.0–0.2)
Bilirubin, Direct: 0.1 mg/dL (ref 0.0–0.2)

## 2023-11-06 LAB — PHOSPHORUS
Phosphorus: <1 mg/dL — CL (ref 2.5–4.6)
Phosphorus: 1.6 mg/dL — ABNORMAL LOW (ref 2.5–4.6)

## 2023-11-06 LAB — MAGNESIUM
Magnesium: 2.5 mg/dL — ABNORMAL HIGH (ref 1.7–2.4)
Magnesium: 2.4 mg/dL (ref 1.7–2.4)

## 2023-11-06 LAB — SODIUM: Sodium: 146 mmol/L — ABNORMAL HIGH (ref 135–145)

## 2023-11-06 LAB — RESP PANEL BY RT-PCR (RSV, FLU A&B, COVID)  RVPGX2
Influenza A by PCR: NEGATIVE
Influenza B by PCR: NEGATIVE
Resp Syncytial Virus by PCR: NEGATIVE
SARS Coronavirus 2 by RT PCR: NEGATIVE

## 2023-11-06 LAB — APTT
aPTT: 43 s — ABNORMAL HIGH (ref 24–36)
aPTT: 39 s — ABNORMAL HIGH (ref 24–36)

## 2023-11-06 LAB — HEMOGLOBIN A1C
Hgb A1c MFr Bld: 5.4 % (ref 4.8–5.6)
Mean Plasma Glucose: 108.28 mg/dL

## 2023-11-06 LAB — PREPARE RBC (CROSSMATCH)

## 2023-11-06 LAB — LIPASE, BLOOD: Lipase: 11 U/L (ref 11–51)

## 2023-11-06 LAB — TROPONIN I (HIGH SENSITIVITY): Troponin I (High Sensitivity): 26 ng/L — ABNORMAL HIGH (ref <18)

## 2023-11-06 MED ORDER — LACTATED RINGERS IV SOLN: INTRAVENOUS | Status: AC

## 2023-11-06 MED ORDER — SODIUM PHOSPHATES 45 MMOLE/15ML IV SOLN
30.0000 mmol | Freq: Once | INTRAVENOUS | Status: AC
  Administered 2023-11-06: 30 mmol via INTRAVENOUS
  Filled 2023-11-06: qty 10

## 2023-11-06 MED ORDER — SODIUM CHLORIDE 0.9% IV SOLUTION: Freq: Once | INTRAVENOUS | Status: AC

## 2023-11-06 MED ORDER — CALCIUM GLUCONATE-NACL 2-0.675 GM/100ML-% IV SOLN
2.0000 g | Freq: Once | INTRAVENOUS | Status: AC
  Administered 2023-11-06: 2000 mg via INTRAVENOUS
  Filled 2023-11-06: qty 100

## 2023-11-06 MED ORDER — ALBUMIN HUMAN 5 % IV SOLN
25.0000 g | Freq: Once | INTRAVENOUS | Status: AC
  Administered 2023-11-06: 25 g via INTRAVENOUS
  Filled 2023-11-06: qty 500

## 2023-11-06 NOTE — Progress Notes (Signed)
 Date and time results received: 11/06/23 7:17 PM  Test: Phosphorus Critical Value: <1.0  Name of Provider Notified: [facility] Team  Orders Received? Or Actions Taken?: Awaiting new orders at this time  River Ambrosio, RN

## 2023-11-06 NOTE — Progress Notes (Signed)
 eLink Physician-Brief Progress Note Patient Name: Joe Keith DOB: Apr 28, 1980 MRN: [id number]   Date of Service  11/06/2023  HPI/Events of Note  Hemoglobin 6.8 [facility] request for blood   eICU Interventions  Ordered to transfuse 1 unit PRBC Beside team secured consent     Intervention Category Intermediate Interventions: Other:  Damien ONEIDA Grout 11/06/2023, 11:49 PM
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
## 2023-11-06 NOTE — Progress Notes (Signed)
 RT transported pt to CT and back with Rnx2 on vent with no complications.

## 2023-11-06 NOTE — Progress Notes (Addendum)
 This RN attended GOC discussion with eldest daughter Conservator, museum/gallery) via videochat, with sister of patient and brother in law at bedside. Dr. Polly explained poor prognosis after speaking with CCM and Neurosurgery team after careful review of scans.  Formal decisions to be made when eldest daughter arrives in person, but family appeared appropriate and accepting of poor quality of life/prognosis since overnight neurologic change.  [facility] notified x2 of the change, first call at 0733 this morning, and second call at 1230. Palliative care consult will also be placed.  1430-- Dr. Polly at bedside again to speak with daughter, Leslee, in person and answer any questions since last discussion. Both daughter and brother in law of the patient expressed potential in working Mr. Mercier towards organ donation. Dr. Polly and I reassured the family that is a personal decision, and we will abide by any next steps needed (comfort v. Organ donation) and our care team will not advocate for one over the another. Advised family to await decision until speaking with [facility] representative, or palliative care team, to make the best medically informed deicsion on behalf of Joe Keith.  1615- CeCe and family decided to proceed with organ donation. This RN reached out to CCM and Trauma MD of the update in care, as well as evaluating for BD vs DCD.  Villa Burgin, RN

## 2023-11-06 NOTE — Progress Notes (Signed)
 eLink Physician-Brief Progress Note Patient Name: Joe Keith DOB: [birth year] MRN: [id number]   Date of Service  11/06/2023  HPI/Events of Note  [facility] request: 1) Tube feed be discontinued 2) Supplement phos for it being <1.0 3) CT chest/abd/ pelvis 4) Start LR at 70ml/hr 5) Albumin  dose of 5% x 2 to reduce the pressors  eICU Interventions  Na phos 30 mmol All above orders placed     Intervention Category Intermediate Interventions: Other:  Damien ONEIDA Grout 11/06/2023, 9:12 PM
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
## 2023-11-06 NOTE — Procedures (Signed)
 Bronchoscopy Procedure Note  Joe Keith  [id number]  10-21-1980  Date:11/06/23  Time:5:55 PM   Provider Performing:Valente Fosberg C Claudene   Procedure(s):  Flexible bronchoscopy with bronchial alveolar lavage (68375)  Indication(s) Donor evaluation  Consent Part of donor consent  Anesthesia None   Time Out Verified patient identification, verified procedure, site/side was marked, verified correct patient position, special equipment/implants available, medications/allergies/relevant history reviewed, required imaging and test results available.   Sterile Technique Usual hand hygiene, masks, gowns, and gloves were used   Procedure Description Bronchoscope advanced through endotracheal tube and into airway.  Airways were examined down to subsegmental level with findings noted below.   Following diagnostic evaluation, BAL(s) performed in RML with normal saline and return of cloudy fluid  Findings:  - Thick mucopurulent secretions bilateral worse in lower lobes   Complications/Tolerance None; patient tolerated the procedure well. Chest X-ray is not needed post procedure.   EBL Minimal   Specimen(s) BAL RML

## 2023-11-06 NOTE — Progress Notes (Signed)
 Pt had an episode of hypotension after nimbex  was restarted due to asynchronous, then it was stopped and CCM made aware, Fentanyl  was titrated down then finally sopped, still unresponsive despite being on Ketamine  1mg  drip, CCM and Trauma Doc notified, new orders given and implemented, CTH to be done when pt is stable
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
## 2023-11-06 NOTE — Progress Notes (Signed)
 Date and time results received: 11/06/23 7:16 PM  Test: Lactic Acid Critical Value: 2.2  Name of Provider Notified: [facility]  Orders Received? Or Actions Taken?: Awaiting new orders at this time, passing along to nighttime RN as well.
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
## 2023-11-07 ENCOUNTER — Inpatient Hospital Stay (HOSPITAL_COMMUNITY)

## 2023-11-07 ENCOUNTER — Other Ambulatory Visit: Payer: Self-pay

## 2023-11-07 LAB — POCT I-STAT 7, (LYTES, BLD GAS, ICA,H+H)
Acid-Base Excess: 2 mmol/L (ref 0.0–2.0)
Acid-Base Excess: 4 mmol/L — ABNORMAL HIGH (ref 0.0–2.0)
Acid-Base Excess: 2 mmol/L (ref 0.0–2.0)
Acid-Base Excess: 2 mmol/L (ref 0.0–2.0)
Acid-Base Excess: 3 mmol/L — ABNORMAL HIGH (ref 0.0–2.0)
Acid-Base Excess: 1 mmol/L (ref 0.0–2.0)
Acid-Base Excess: 1 mmol/L (ref 0.0–2.0)
Bicarbonate: 27 mmol/L (ref 20.0–28.0)
Bicarbonate: 28.9 mmol/L — ABNORMAL HIGH (ref 20.0–28.0)
Bicarbonate: 26.8 mmol/L (ref 20.0–28.0)
Bicarbonate: 26.1 mmol/L (ref 20.0–28.0)
Bicarbonate: 28 mmol/L (ref 20.0–28.0)
Bicarbonate: 26.8 mmol/L (ref 20.0–28.0)
Bicarbonate: 26 mmol/L (ref 20.0–28.0)
Calcium, Ion: 1.15 mmol/L (ref 1.15–1.40)
Calcium, Ion: 1.19 mmol/L (ref 1.15–1.40)
Calcium, Ion: 1.11 mmol/L — ABNORMAL LOW (ref 1.15–1.40)
Calcium, Ion: 1.12 mmol/L — ABNORMAL LOW (ref 1.15–1.40)
Calcium, Ion: 1.15 mmol/L (ref 1.15–1.40)
Calcium, Ion: 1.14 mmol/L — ABNORMAL LOW (ref 1.15–1.40)
Calcium, Ion: 1.12 mmol/L — ABNORMAL LOW (ref 1.15–1.40)
HCT: 20 % — ABNORMAL LOW (ref 39.0–52.0)
HCT: 21 % — ABNORMAL LOW (ref 39.0–52.0)
HCT: 22 % — ABNORMAL LOW (ref 39.0–52.0)
HCT: 21 % — ABNORMAL LOW (ref 39.0–52.0)
HCT: 20 % — ABNORMAL LOW (ref 39.0–52.0)
HCT: 20 % — ABNORMAL LOW (ref 39.0–52.0)
HCT: 20 % — ABNORMAL LOW (ref 39.0–52.0)
Hemoglobin: 6.8 g/dL — CL (ref 13.0–17.0)
Hemoglobin: 7.1 g/dL — ABNORMAL LOW (ref 13.0–17.0)
Hemoglobin: 7.5 g/dL — ABNORMAL LOW (ref 13.0–17.0)
Hemoglobin: 7.1 g/dL — ABNORMAL LOW (ref 13.0–17.0)
Hemoglobin: 6.8 g/dL — CL (ref 13.0–17.0)
Hemoglobin: 6.8 g/dL — CL (ref 13.0–17.0)
Hemoglobin: 6.8 g/dL — CL (ref 13.0–17.0)
O2 Saturation: 100 %
O2 Saturation: 100 %
O2 Saturation: 100 %
O2 Saturation: 100 %
O2 Saturation: 100 %
O2 Saturation: 100 %
O2 Saturation: 100 %
Patient temperature: 37.3
Patient temperature: 99.3
Patient temperature: 38.1
Patient temperature: 38
Patient temperature: 36.9
Patient temperature: 37.2
Potassium: 4 mmol/L (ref 3.5–5.1)
Potassium: 4.1 mmol/L (ref 3.5–5.1)
Potassium: 4.1 mmol/L (ref 3.5–5.1)
Potassium: 3.7 mmol/L (ref 3.5–5.1)
Potassium: 3.8 mmol/L (ref 3.5–5.1)
Potassium: 3.7 mmol/L (ref 3.5–5.1)
Potassium: 3.6 mmol/L (ref 3.5–5.1)
Sodium: 145 mmol/L (ref 135–145)
Sodium: 147 mmol/L — ABNORMAL HIGH (ref 135–145)
Sodium: 146 mmol/L — ABNORMAL HIGH (ref 135–145)
Sodium: 148 mmol/L — ABNORMAL HIGH (ref 135–145)
Sodium: 146 mmol/L — ABNORMAL HIGH (ref 135–145)
Sodium: 148 mmol/L — ABNORMAL HIGH (ref 135–145)
Sodium: 151 mmol/L — ABNORMAL HIGH (ref 135–145)
TCO2: 28 mmol/L (ref 22–32)
TCO2: 30 mmol/L (ref 22–32)
TCO2: 28 mmol/L (ref 22–32)
TCO2: 27 mmol/L (ref 22–32)
TCO2: 29 mmol/L (ref 22–32)
TCO2: 28 mmol/L (ref 22–32)
TCO2: 27 mmol/L (ref 22–32)
pCO2 arterial: 44.5 mmHg (ref 32–48)
pCO2 arterial: 45.9 mmHg (ref 32–48)
pCO2 arterial: 47.3 mmHg (ref 32–48)
pCO2 arterial: 41.4 mmHg (ref 32–48)
pCO2 arterial: 45.4 mmHg (ref 32–48)
pCO2 arterial: 47.6 mmHg (ref 32–48)
pCO2 arterial: 43.5 mmHg (ref 32–48)
pH, Arterial: 7.38 (ref 7.35–7.45)
pH, Arterial: 7.421 (ref 7.35–7.45)
pH, Arterial: 7.367 (ref 7.35–7.45)
pH, Arterial: 7.412 (ref 7.35–7.45)
pH, Arterial: 7.397 (ref 7.35–7.45)
pH, Arterial: 7.36 (ref 7.35–7.45)
pH, Arterial: 7.385 (ref 7.35–7.45)
pO2, Arterial: 205 mmHg — ABNORMAL HIGH (ref 83–108)
pO2, Arterial: 229 mmHg — ABNORMAL HIGH (ref 83–108)
pO2, Arterial: 247 mmHg — ABNORMAL HIGH (ref 83–108)
pO2, Arterial: 245 mmHg — ABNORMAL HIGH (ref 83–108)
pO2, Arterial: 362 mmHg — ABNORMAL HIGH (ref 83–108)
pO2, Arterial: 338 mmHg — ABNORMAL HIGH (ref 83–108)
pO2, Arterial: 278 mmHg — ABNORMAL HIGH (ref 83–108)

## 2023-11-07 LAB — APTT
aPTT: 39 s — ABNORMAL HIGH (ref 24–36)
aPTT: 37 s — ABNORMAL HIGH (ref 24–36)
aPTT: 37 s — ABNORMAL HIGH (ref 24–36)

## 2023-11-07 LAB — MAGNESIUM
Magnesium: 2.4 mg/dL (ref 1.7–2.4)
Magnesium: 2.5 mg/dL — ABNORMAL HIGH (ref 1.7–2.4)
Magnesium: 2.7 mg/dL — ABNORMAL HIGH (ref 1.7–2.4)

## 2023-11-07 LAB — COMPREHENSIVE METABOLIC PANEL WITH GFR
ALT: 17 U/L (ref 0–44)
ALT: 16 U/L (ref 0–44)
ALT: 17 U/L (ref 0–44)
AST: 23 U/L (ref 15–41)
AST: 21 U/L (ref 15–41)
AST: 21 U/L (ref 15–41)
Albumin: 1.9 g/dL — ABNORMAL LOW (ref 3.5–5.0)
Albumin: 1.9 g/dL — ABNORMAL LOW (ref 3.5–5.0)
Albumin: 1.8 g/dL — ABNORMAL LOW (ref 3.5–5.0)
Alkaline Phosphatase: 78 U/L (ref 38–126)
Alkaline Phosphatase: 75 U/L (ref 38–126)
Alkaline Phosphatase: 81 U/L (ref 38–126)
Anion gap: 12 (ref 5–15)
Anion gap: 10 (ref 5–15)
Anion gap: 13 (ref 5–15)
BUN: 48 mg/dL — ABNORMAL HIGH (ref 6–20)
BUN: 46 mg/dL — ABNORMAL HIGH (ref 6–20)
BUN: 42 mg/dL — ABNORMAL HIGH (ref 6–20)
CO2: 24 mmol/L (ref 22–32)
CO2: 26 mmol/L (ref 22–32)
CO2: 24 mmol/L (ref 22–32)
Calcium: 7.9 mg/dL — ABNORMAL LOW (ref 8.9–10.3)
Calcium: 7.8 mg/dL — ABNORMAL LOW (ref 8.9–10.3)
Calcium: 8 mg/dL — ABNORMAL LOW (ref 8.9–10.3)
Chloride: 110 mmol/L (ref 98–111)
Chloride: 110 mmol/L (ref 98–111)
Chloride: 110 mmol/L (ref 98–111)
Creatinine, Ser: 1.35 mg/dL — ABNORMAL HIGH (ref 0.61–1.24)
Creatinine, Ser: 1.27 mg/dL — ABNORMAL HIGH (ref 0.61–1.24)
Creatinine, Ser: 1.27 mg/dL — ABNORMAL HIGH (ref 0.61–1.24)
GFR, Estimated: >60 mL/min (ref >60)
GFR, Estimated: >60 mL/min (ref >60)
GFR, Estimated: >60 mL/min (ref >60)
Glucose, Bld: 352 mg/dL — ABNORMAL HIGH (ref 70–99)
Glucose, Bld: 353 mg/dL — ABNORMAL HIGH (ref 70–99)
Glucose, Bld: 303 mg/dL — ABNORMAL HIGH (ref 70–99)
Potassium: 3.8 mmol/L (ref 3.5–5.1)
Potassium: 3.9 mmol/L (ref 3.5–5.1)
Potassium: 3.7 mmol/L (ref 3.5–5.1)
Sodium: 146 mmol/L — ABNORMAL HIGH (ref 135–145)
Sodium: 146 mmol/L — ABNORMAL HIGH (ref 135–145)
Sodium: 147 mmol/L — ABNORMAL HIGH (ref 135–145)
Total Bilirubin: 0.7 mg/dL (ref 0.0–1.2)
Total Bilirubin: 0.7 mg/dL (ref 0.0–1.2)
Total Bilirubin: 0.7 mg/dL (ref 0.0–1.2)
Total Protein: 5.7 g/dL — ABNORMAL LOW (ref 6.5–8.1)
Total Protein: 5.7 g/dL — ABNORMAL LOW (ref 6.5–8.1)
Total Protein: 5.8 g/dL — ABNORMAL LOW (ref 6.5–8.1)

## 2023-11-07 LAB — URINE CULTURE: Culture: NO GROWTH

## 2023-11-07 LAB — CBC
HCT: 23 % — ABNORMAL LOW (ref 39.0–52.0)
HCT: 23 % — ABNORMAL LOW (ref 39.0–52.0)
HCT: 23.2 % — ABNORMAL LOW (ref 39.0–52.0)
Hemoglobin: 7.2 g/dL — ABNORMAL LOW (ref 13.0–17.0)
Hemoglobin: 7.3 g/dL — ABNORMAL LOW (ref 13.0–17.0)
Hemoglobin: 7.2 g/dL — ABNORMAL LOW (ref 13.0–17.0)
MCH: 28.1 pg (ref 26.0–34.0)
MCH: 28.7 pg (ref 26.0–34.0)
MCH: 28.3 pg (ref 26.0–34.0)
MCHC: 31.3 g/dL (ref 30.0–36.0)
MCHC: 31.7 g/dL (ref 30.0–36.0)
MCHC: 31 g/dL (ref 30.0–36.0)
MCV: 89.8 fL (ref 80.0–100.0)
MCV: 90.6 fL (ref 80.0–100.0)
MCV: 91.3 fL (ref 80.0–100.0)
Platelets: 179 K/uL (ref 150–400)
Platelets: 193 K/uL (ref 150–400)
Platelets: 182 K/uL (ref 150–400)
RBC: 2.56 MIL/uL — ABNORMAL LOW (ref 4.22–5.81)
RBC: 2.54 MIL/uL — ABNORMAL LOW (ref 4.22–5.81)
RBC: 2.54 MIL/uL — ABNORMAL LOW (ref 4.22–5.81)
RDW: 15.7 % — ABNORMAL HIGH (ref 11.5–15.5)
RDW: 15.7 % — ABNORMAL HIGH (ref 11.5–15.5)
RDW: 15.9 % — ABNORMAL HIGH (ref 11.5–15.5)
WBC: 9.2 K/uL (ref 4.0–10.5)
WBC: 9.5 K/uL (ref 4.0–10.5)
WBC: 7.5 K/uL (ref 4.0–10.5)
nRBC: 0.2 % (ref 0.0–0.2)
nRBC: 0 % (ref 0.0–0.2)
nRBC: 0.3 % — ABNORMAL HIGH (ref 0.0–0.2)

## 2023-11-07 LAB — BILIRUBIN, DIRECT
Bilirubin, Direct: 0.1 mg/dL (ref 0.0–0.2)
Bilirubin, Direct: 0.2 mg/dL (ref 0.0–0.2)
Bilirubin, Direct: 0.2 mg/dL (ref 0.0–0.2)

## 2023-11-07 LAB — GLUCOSE, CAPILLARY
Glucose-Capillary: 311 mg/dL — ABNORMAL HIGH (ref 70–99)
Glucose-Capillary: 303 mg/dL — ABNORMAL HIGH (ref 70–99)
Glucose-Capillary: 307 mg/dL — ABNORMAL HIGH (ref 70–99)
Glucose-Capillary: 315 mg/dL — ABNORMAL HIGH (ref 70–99)
Glucose-Capillary: 331 mg/dL — ABNORMAL HIGH (ref 70–99)
Glucose-Capillary: 327 mg/dL — ABNORMAL HIGH (ref 70–99)
Glucose-Capillary: 302 mg/dL — ABNORMAL HIGH (ref 70–99)
Glucose-Capillary: 252 mg/dL — ABNORMAL HIGH (ref 70–99)
Glucose-Capillary: 262 mg/dL — ABNORMAL HIGH (ref 70–99)
Glucose-Capillary: 237 mg/dL — ABNORMAL HIGH (ref 70–99)
Glucose-Capillary: 217 mg/dL — ABNORMAL HIGH (ref 70–99)
Glucose-Capillary: 222 mg/dL — ABNORMAL HIGH (ref 70–99)
Glucose-Capillary: 230 mg/dL — ABNORMAL HIGH (ref 70–99)

## 2023-11-07 LAB — CK TOTAL AND CKMB (NOT AT ARMC)
CK, MB: 2 ng/mL (ref 0.5–5.0)
CK, MB: 1.6 ng/mL (ref 0.5–5.0)
CK, MB: 1.3 ng/mL (ref 0.5–5.0)
Total CK: 455 U/L — ABNORMAL HIGH (ref 49–397)
Total CK: 369 U/L (ref 49–397)
Total CK: 289 U/L (ref 49–397)

## 2023-11-07 LAB — LACTIC ACID, PLASMA
Lactic Acid, Venous: 1.9 mmol/L (ref 0.5–1.9)
Lactic Acid, Venous: 2 mmol/L (ref 0.5–1.9)
Lactic Acid, Venous: 1.3 mmol/L (ref 0.5–1.9)
Lactic Acid, Venous: 2.2 mmol/L (ref 0.5–1.9)

## 2023-11-07 LAB — AMYLASE
Amylase: 13 U/L — ABNORMAL LOW (ref 28–100)
Amylase: 13 U/L — ABNORMAL LOW (ref 28–100)
Amylase: 12 U/L — ABNORMAL LOW (ref 28–100)

## 2023-11-07 LAB — PHOSPHORUS
Phosphorus: 3 mg/dL (ref 2.5–4.6)
Phosphorus: 2.6 mg/dL (ref 2.5–4.6)
Phosphorus: 2.8 mg/dL (ref 2.5–4.6)

## 2023-11-07 LAB — PROTIME-INR
INR: 1.2 (ref 0.8–1.2)
INR: 1.2 (ref 0.8–1.2)
INR: 1.2 (ref 0.8–1.2)
Prothrombin Time: 16 s — ABNORMAL HIGH (ref 11.4–15.2)
Prothrombin Time: 15.6 s — ABNORMAL HIGH (ref 11.4–15.2)
Prothrombin Time: 16.2 s — ABNORMAL HIGH (ref 11.4–15.2)

## 2023-11-07 LAB — TRIGLYCERIDES: Triglycerides: 310 mg/dL — ABNORMAL HIGH (ref <150)

## 2023-11-07 LAB — TROPONIN I (HIGH SENSITIVITY): Troponin I (High Sensitivity): 20 ng/L — ABNORMAL HIGH (ref <18)

## 2023-11-07 LAB — ABO/RH
ABO/RH(D): A POS
PT AG Type: POSITIVE

## 2023-11-07 LAB — LIPASE, BLOOD
Lipase: 11 U/L (ref 11–51)
Lipase: 12 U/L (ref 11–51)
Lipase: 12 U/L (ref 11–51)
Lipase: 13 U/L (ref 11–51)

## 2023-11-07 MED ORDER — LEVOTHYROXINE SODIUM 100 MCG/5ML IV SOLN
20.0000 ug | Freq: Once | INTRAVENOUS | Status: AC
  Administered 2023-11-07: 20 ug via INTRAVENOUS
  Filled 2023-11-07: qty 5

## 2023-11-07 MED ORDER — LACTATED RINGERS IV SOLN: INTRAVENOUS | Status: DC

## 2023-11-07 MED ORDER — INSULIN ASPART 100 UNIT/ML IJ SOLN
0.0000 [IU] | INTRAMUSCULAR | Status: DC
  Administered 2023-11-07: 15 [IU] via SUBCUTANEOUS

## 2023-11-07 MED ORDER — INSULIN ASPART 100 UNIT/ML IJ SOLN
20.0000 [IU] | Freq: Once | INTRAMUSCULAR | Status: AC
  Administered 2023-11-07: 20 [IU] via SUBCUTANEOUS

## 2023-11-07 MED ORDER — INSULIN REGULAR(HUMAN) IN NACL 100-0.9 UT/100ML-% IV SOLN
INTRAVENOUS | Status: DC
  Administered 2023-11-07: 8.5 [IU]/h via INTRAVENOUS
  Administered 2023-11-07 – 2023-11-08 (×2): 13 [IU]/h via INTRAVENOUS
  Administered 2023-11-08: 15 [IU]/h via INTRAVENOUS
  Administered 2023-11-09: 9.5 [IU]/h via INTRAVENOUS
  Administered 2023-11-09: 8.5 [IU]/h via INTRAVENOUS
  Filled 2023-11-07 (×7): qty 100

## 2023-11-07 MED ORDER — SODIUM CHLORIDE 0.9 % IV SOLN
1000.0000 mg | Freq: Once | INTRAVENOUS | Status: AC
  Administered 2023-11-07: 1000 mg via INTRAVENOUS
  Filled 2023-11-07: qty 16

## 2023-11-07 MED ORDER — DEXTROSE 50 % IV SOLN
12.5000 g | Freq: Once | INTRAVENOUS | Status: AC
  Administered 2023-11-07: 12.5 g via INTRAVENOUS
  Filled 2023-11-07: qty 50

## 2023-11-07 MED ORDER — SODIUM CHLORIDE 0.9 % IV SOLN
0.0000 ug/h | INTRAVENOUS | Status: DC
  Administered 2023-11-07: 3 ug/h via INTRAVENOUS
  Administered 2023-11-08 (×2): 20 ug/h via INTRAVENOUS
  Filled 2023-11-07 (×3): qty 10

## 2023-11-07 NOTE — Progress Notes (Signed)
 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 Family has agreed to organ donation. Tube feeds discontinued due to uncontrolled high blood sugars 9/21 and will not be re-initiated as organ donation will not include GI tract. Labs and medications reviewed. RD will sign off now. Please re-consult if status changes.     Josette Glance, MS, RDN, LDN Clinical Dietitian I Please reach out via secure chat

## 2023-11-07 NOTE — Progress Notes (Signed)
  Echocardiogram 2D Echocardiogram has been attempted. Spoke with nurse, echo needed after pt is off Levo. Hoping to complete tomorrow   Joe Keith 11/07/2023, 12:00 PM
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
## 2023-11-07 NOTE — Progress Notes (Signed)
 ABG not crossing over to Epic Results @23 :57 On 100% fio2  PH= 7.38 pO2= 228 HCO3-= 27 PaCO2= 45 BE= 2 TCO2= 23 SO2= 100  NA= 145 K=4.1 iCA= 1.15 HCT= 20 Hb= 6.8
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
## 2023-11-07 NOTE — Progress Notes (Signed)
 Morna Kerns, Medical Examiner at bedside to obtain a baseline assessment of the patient and take pictures for autopsy reference. All questions answered. Requested a phone call with [facility] once brain death is determined. Phone number is [phone].

## 2023-11-08 ENCOUNTER — Inpatient Hospital Stay (HOSPITAL_COMMUNITY)

## 2023-11-08 DIAGNOSIS — Z529 Donor of unspecified organ or tissue: Secondary | ICD-10-CM | POA: Diagnosis not present

## 2023-11-08 LAB — COMPREHENSIVE METABOLIC PANEL WITH GFR
ALT: 18 U/L (ref 0–44)
ALT: 19 U/L (ref 0–44)
ALT: 21 U/L (ref 0–44)
ALT: 21 U/L (ref 0–44)
AST: 25 U/L (ref 15–41)
AST: 23 U/L (ref 15–41)
AST: 24 U/L (ref 15–41)
AST: 25 U/L (ref 15–41)
Albumin: 1.8 g/dL — ABNORMAL LOW (ref 3.5–5.0)
Albumin: 1.9 g/dL — ABNORMAL LOW (ref 3.5–5.0)
Albumin: 1.9 g/dL — ABNORMAL LOW (ref 3.5–5.0)
Albumin: 1.8 g/dL — ABNORMAL LOW (ref 3.5–5.0)
Alkaline Phosphatase: 76 U/L (ref 38–126)
Alkaline Phosphatase: 76 U/L (ref 38–126)
Alkaline Phosphatase: 76 U/L (ref 38–126)
Alkaline Phosphatase: 76 U/L (ref 38–126)
Anion gap: 10 (ref 5–15)
Anion gap: 11 (ref 5–15)
Anion gap: 12 (ref 5–15)
Anion gap: 11 (ref 5–15)
BUN: 46 mg/dL — ABNORMAL HIGH (ref 6–20)
BUN: 44 mg/dL — ABNORMAL HIGH (ref 6–20)
BUN: 44 mg/dL — ABNORMAL HIGH (ref 6–20)
BUN: 43 mg/dL — ABNORMAL HIGH (ref 6–20)
CO2: 25 mmol/L (ref 22–32)
CO2: 25 mmol/L (ref 22–32)
CO2: 24 mmol/L (ref 22–32)
CO2: 23 mmol/L (ref 22–32)
Calcium: 8.1 mg/dL — ABNORMAL LOW (ref 8.9–10.3)
Calcium: 8.4 mg/dL — ABNORMAL LOW (ref 8.9–10.3)
Calcium: 8.3 mg/dL — ABNORMAL LOW (ref 8.9–10.3)
Calcium: 8.2 mg/dL — ABNORMAL LOW (ref 8.9–10.3)
Chloride: 113 mmol/L — ABNORMAL HIGH (ref 98–111)
Chloride: 113 mmol/L — ABNORMAL HIGH (ref 98–111)
Chloride: 115 mmol/L — ABNORMAL HIGH (ref 98–111)
Chloride: 116 mmol/L — ABNORMAL HIGH (ref 98–111)
Creatinine, Ser: 1.22 mg/dL (ref 0.61–1.24)
Creatinine, Ser: 1.06 mg/dL (ref 0.61–1.24)
Creatinine, Ser: 1.13 mg/dL (ref 0.61–1.24)
Creatinine, Ser: 1.27 mg/dL — ABNORMAL HIGH (ref 0.61–1.24)
GFR, Estimated: >60 mL/min (ref >60)
GFR, Estimated: >60 mL/min (ref >60)
GFR, Estimated: >60 mL/min (ref >60)
GFR, Estimated: >60 mL/min (ref >60)
Glucose, Bld: 242 mg/dL — ABNORMAL HIGH (ref 70–99)
Glucose, Bld: 195 mg/dL — ABNORMAL HIGH (ref 70–99)
Glucose, Bld: 201 mg/dL — ABNORMAL HIGH (ref 70–99)
Glucose, Bld: 148 mg/dL — ABNORMAL HIGH (ref 70–99)
Potassium: 3.7 mmol/L (ref 3.5–5.1)
Potassium: 3.2 mmol/L — ABNORMAL LOW (ref 3.5–5.1)
Potassium: 3.6 mmol/L (ref 3.5–5.1)
Potassium: 3.7 mmol/L (ref 3.5–5.1)
Sodium: 148 mmol/L — ABNORMAL HIGH (ref 135–145)
Sodium: 149 mmol/L — ABNORMAL HIGH (ref 135–145)
Sodium: 151 mmol/L — ABNORMAL HIGH (ref 135–145)
Sodium: 150 mmol/L — ABNORMAL HIGH (ref 135–145)
Total Bilirubin: 0.7 mg/dL (ref 0.0–1.2)
Total Bilirubin: 0.7 mg/dL (ref 0.0–1.2)
Total Bilirubin: 0.8 mg/dL (ref 0.0–1.2)
Total Bilirubin: 0.7 mg/dL (ref 0.0–1.2)
Total Protein: 5.7 g/dL — ABNORMAL LOW (ref 6.5–8.1)
Total Protein: 5.8 g/dL — ABNORMAL LOW (ref 6.5–8.1)
Total Protein: 5.6 g/dL — ABNORMAL LOW (ref 6.5–8.1)
Total Protein: 5.4 g/dL — ABNORMAL LOW (ref 6.5–8.1)

## 2023-11-08 LAB — POCT I-STAT 7, (LYTES, BLD GAS, ICA,H+H)
Acid-Base Excess: 0 mmol/L (ref 0.0–2.0)
Acid-Base Excess: 1 mmol/L (ref 0.0–2.0)
Acid-Base Excess: 3 mmol/L — ABNORMAL HIGH (ref 0.0–2.0)
Acid-Base Excess: 3 mmol/L — ABNORMAL HIGH (ref 0.0–2.0)
Acid-Base Excess: 4 mmol/L — ABNORMAL HIGH (ref 0.0–2.0)
Acid-Base Excess: 4 mmol/L — ABNORMAL HIGH (ref 0.0–2.0)
Acid-Base Excess: 5 mmol/L — ABNORMAL HIGH (ref 0.0–2.0)
Bicarbonate: 23.8 mmol/L (ref 20.0–28.0)
Bicarbonate: 26 mmol/L (ref 20.0–28.0)
Bicarbonate: 26.9 mmol/L (ref 20.0–28.0)
Bicarbonate: 27.8 mmol/L (ref 20.0–28.0)
Bicarbonate: 28.4 mmol/L — ABNORMAL HIGH (ref 20.0–28.0)
Bicarbonate: 28.9 mmol/L — ABNORMAL HIGH (ref 20.0–28.0)
Bicarbonate: 29.4 mmol/L — ABNORMAL HIGH (ref 20.0–28.0)
Calcium, Ion: 1.19 mmol/L (ref 1.15–1.40)
Calcium, Ion: 1.19 mmol/L (ref 1.15–1.40)
Calcium, Ion: 1.19 mmol/L (ref 1.15–1.40)
Calcium, Ion: 1.19 mmol/L (ref 1.15–1.40)
Calcium, Ion: 1.21 mmol/L (ref 1.15–1.40)
Calcium, Ion: 1.22 mmol/L (ref 1.15–1.40)
Calcium, Ion: 1.24 mmol/L (ref 1.15–1.40)
HCT: 18 % — ABNORMAL LOW (ref 39.0–52.0)
HCT: 21 % — ABNORMAL LOW (ref 39.0–52.0)
HCT: 21 % — ABNORMAL LOW (ref 39.0–52.0)
HCT: 29 % — ABNORMAL LOW (ref 39.0–52.0)
HCT: 35 % — ABNORMAL LOW (ref 39.0–52.0)
HCT: 50 % (ref 39.0–52.0)
HCT: 54 % — ABNORMAL HIGH (ref 39.0–52.0)
Hemoglobin: 11.9 g/dL — ABNORMAL LOW (ref 13.0–17.0)
Hemoglobin: 17 g/dL (ref 13.0–17.0)
Hemoglobin: 18.4 g/dL — ABNORMAL HIGH (ref 13.0–17.0)
Hemoglobin: 6.1 g/dL — CL (ref 13.0–17.0)
Hemoglobin: 7.1 g/dL — ABNORMAL LOW (ref 13.0–17.0)
Hemoglobin: 7.1 g/dL — ABNORMAL LOW (ref 13.0–17.0)
Hemoglobin: 9.9 g/dL — ABNORMAL LOW (ref 13.0–17.0)
O2 Saturation: 100 %
O2 Saturation: 100 %
O2 Saturation: 100 %
O2 Saturation: 100 %
O2 Saturation: 100 %
O2 Saturation: 100 %
O2 Saturation: 99 %
Patient temperature: 35.9
Patient temperature: 98.2
Patient temperature: 37.8
Potassium: 3.1 mmol/L — ABNORMAL LOW (ref 3.5–5.1)
Potassium: 3.2 mmol/L — ABNORMAL LOW (ref 3.5–5.1)
Potassium: 3.3 mmol/L — ABNORMAL LOW (ref 3.5–5.1)
Potassium: 3.5 mmol/L (ref 3.5–5.1)
Potassium: 3.6 mmol/L (ref 3.5–5.1)
Potassium: 3.7 mmol/L (ref 3.5–5.1)
Potassium: 3.8 mmol/L (ref 3.5–5.1)
Sodium: 150 mmol/L — ABNORMAL HIGH (ref 135–145)
Sodium: 150 mmol/L — ABNORMAL HIGH (ref 135–145)
Sodium: 151 mmol/L — ABNORMAL HIGH (ref 135–145)
Sodium: 151 mmol/L — ABNORMAL HIGH (ref 135–145)
Sodium: 152 mmol/L — ABNORMAL HIGH (ref 135–145)
Sodium: 154 mmol/L — ABNORMAL HIGH (ref 135–145)
Sodium: 155 mmol/L — ABNORMAL HIGH (ref 135–145)
TCO2: 25 mmol/L (ref 22–32)
TCO2: 27 mmol/L (ref 22–32)
TCO2: 28 mmol/L (ref 22–32)
TCO2: 29 mmol/L (ref 22–32)
TCO2: 30 mmol/L (ref 22–32)
TCO2: 30 mmol/L (ref 22–32)
TCO2: 31 mmol/L (ref 22–32)
pCO2 arterial: 35.6 mmHg (ref 32–48)
pCO2 arterial: 36.7 mmHg (ref 32–48)
pCO2 arterial: 36.9 mmHg (ref 32–48)
pCO2 arterial: 43.5 mmHg (ref 32–48)
pCO2 arterial: 44 mmHg (ref 32–48)
pCO2 arterial: 45.2 mmHg (ref 32–48)
pCO2 arterial: 46.3 mmHg (ref 32–48)
pH, Arterial: 7.389 (ref 7.35–7.45)
pH, Arterial: 7.396 (ref 7.35–7.45)
pH, Arterial: 7.413 (ref 7.35–7.45)
pH, Arterial: 7.433 (ref 7.35–7.45)
pH, Arterial: 7.433 (ref 7.35–7.45)
pH, Arterial: 7.471 — ABNORMAL HIGH (ref 7.35–7.45)
pH, Arterial: 7.482 — ABNORMAL HIGH (ref 7.35–7.45)
pO2, Arterial: 158 mmHg — ABNORMAL HIGH (ref 83–108)
pO2, Arterial: 202 mmHg — ABNORMAL HIGH (ref 83–108)
pO2, Arterial: 202 mmHg — ABNORMAL HIGH (ref 83–108)
pO2, Arterial: 221 mmHg — ABNORMAL HIGH (ref 83–108)
pO2, Arterial: 223 mmHg — ABNORMAL HIGH (ref 83–108)
pO2, Arterial: 297 mmHg — ABNORMAL HIGH (ref 83–108)
pO2, Arterial: 301 mmHg — ABNORMAL HIGH (ref 83–108)

## 2023-11-08 LAB — PROTIME-INR
INR: 1.2 (ref 0.8–1.2)
INR: 1.2 (ref 0.8–1.2)
INR: 1.2 (ref 0.8–1.2)
INR: 1.2 (ref 0.8–1.2)
Prothrombin Time: 15.9 s — ABNORMAL HIGH (ref 11.4–15.2)
Prothrombin Time: 15.6 s — ABNORMAL HIGH (ref 11.4–15.2)
Prothrombin Time: 15.8 s — ABNORMAL HIGH (ref 11.4–15.2)
Prothrombin Time: 16.3 s — ABNORMAL HIGH (ref 11.4–15.2)

## 2023-11-08 LAB — CK TOTAL AND CKMB (NOT AT ARMC)
CK, MB: 2 ng/mL (ref 0.5–5.0)
CK, MB: 2.8 ng/mL (ref 0.5–5.0)
CK, MB: 2.7 ng/mL (ref 0.5–5.0)
CK, MB: 1.5 ng/mL (ref 0.5–5.0)
Total CK: 235 U/L (ref 49–397)
Total CK: 215 U/L (ref 49–397)
Total CK: 168 U/L (ref 49–397)
Total CK: 135 U/L (ref 49–397)

## 2023-11-08 LAB — URINALYSIS, ROUTINE W REFLEX MICROSCOPIC
Bilirubin Urine: NEGATIVE
Glucose, UA: NEGATIVE mg/dL
Ketones, ur: NEGATIVE mg/dL
Leukocytes,Ua: NEGATIVE
Nitrite: NEGATIVE
Protein, ur: 30 mg/dL — AB
Specific Gravity, Urine: 1.028 (ref 1.005–1.030)
pH: 5 (ref 5.0–8.0)

## 2023-11-08 LAB — CBC
HCT: 22.2 % — ABNORMAL LOW (ref 39.0–52.0)
HCT: 22.8 % — ABNORMAL LOW (ref 39.0–52.0)
HCT: 23 % — ABNORMAL LOW (ref 39.0–52.0)
HCT: 23.1 % — ABNORMAL LOW (ref 39.0–52.0)
Hemoglobin: 6.9 g/dL — CL (ref 13.0–17.0)
Hemoglobin: 7.1 g/dL — ABNORMAL LOW (ref 13.0–17.0)
Hemoglobin: 7.2 g/dL — ABNORMAL LOW (ref 13.0–17.0)
Hemoglobin: 7.2 g/dL — ABNORMAL LOW (ref 13.0–17.0)
MCH: 28.3 pg (ref 26.0–34.0)
MCH: 28.3 pg (ref 26.0–34.0)
MCH: 28.4 pg (ref 26.0–34.0)
MCH: 28.5 pg (ref 26.0–34.0)
MCHC: 31.1 g/dL (ref 30.0–36.0)
MCHC: 31.1 g/dL (ref 30.0–36.0)
MCHC: 31.2 g/dL (ref 30.0–36.0)
MCHC: 31.3 g/dL (ref 30.0–36.0)
MCV: 90.6 fL (ref 80.0–100.0)
MCV: 90.9 fL (ref 80.0–100.0)
MCV: 91.2 fL (ref 80.0–100.0)
MCV: 91.7 fL (ref 80.0–100.0)
Platelets: 179 K/uL (ref 150–400)
Platelets: 195 K/uL (ref 150–400)
Platelets: 219 K/uL (ref 150–400)
Platelets: 240 K/uL (ref 150–400)
RBC: 2.42 MIL/uL — ABNORMAL LOW (ref 4.22–5.81)
RBC: 2.5 MIL/uL — ABNORMAL LOW (ref 4.22–5.81)
RBC: 2.54 MIL/uL — ABNORMAL LOW (ref 4.22–5.81)
RBC: 2.54 MIL/uL — ABNORMAL LOW (ref 4.22–5.81)
RDW: 15.9 % — ABNORMAL HIGH (ref 11.5–15.5)
RDW: 15.9 % — ABNORMAL HIGH (ref 11.5–15.5)
RDW: 15.9 % — ABNORMAL HIGH (ref 11.5–15.5)
RDW: 15.9 % — ABNORMAL HIGH (ref 11.5–15.5)
WBC: 10.8 K/uL — ABNORMAL HIGH (ref 4.0–10.5)
WBC: 12.4 K/uL — ABNORMAL HIGH (ref 4.0–10.5)
WBC: 13.1 K/uL — ABNORMAL HIGH (ref 4.0–10.5)
WBC: 8.9 K/uL (ref 4.0–10.5)
nRBC: 0 % (ref 0.0–0.2)
nRBC: 0.2 % (ref 0.0–0.2)
nRBC: 0.4 % — ABNORMAL HIGH (ref 0.0–0.2)
nRBC: 0.5 % — ABNORMAL HIGH (ref 0.0–0.2)

## 2023-11-08 LAB — GLUCOSE, CAPILLARY
Glucose-Capillary: 130 mg/dL — ABNORMAL HIGH (ref 70–99)
Glucose-Capillary: 141 mg/dL — ABNORMAL HIGH (ref 70–99)
Glucose-Capillary: 147 mg/dL — ABNORMAL HIGH (ref 70–99)
Glucose-Capillary: 147 mg/dL — ABNORMAL HIGH (ref 70–99)
Glucose-Capillary: 149 mg/dL — ABNORMAL HIGH (ref 70–99)
Glucose-Capillary: 158 mg/dL — ABNORMAL HIGH (ref 70–99)
Glucose-Capillary: 167 mg/dL — ABNORMAL HIGH (ref 70–99)
Glucose-Capillary: 170 mg/dL — ABNORMAL HIGH (ref 70–99)
Glucose-Capillary: 178 mg/dL — ABNORMAL HIGH (ref 70–99)
Glucose-Capillary: 182 mg/dL — ABNORMAL HIGH (ref 70–99)
Glucose-Capillary: 184 mg/dL — ABNORMAL HIGH (ref 70–99)
Glucose-Capillary: 189 mg/dL — ABNORMAL HIGH (ref 70–99)
Glucose-Capillary: 191 mg/dL — ABNORMAL HIGH (ref 70–99)
Glucose-Capillary: 192 mg/dL — ABNORMAL HIGH (ref 70–99)
Glucose-Capillary: 192 mg/dL — ABNORMAL HIGH (ref 70–99)
Glucose-Capillary: 192 mg/dL — ABNORMAL HIGH (ref 70–99)
Glucose-Capillary: 200 mg/dL — ABNORMAL HIGH (ref 70–99)
Glucose-Capillary: 201 mg/dL — ABNORMAL HIGH (ref 70–99)
Glucose-Capillary: 207 mg/dL — ABNORMAL HIGH (ref 70–99)
Glucose-Capillary: 223 mg/dL — ABNORMAL HIGH (ref 70–99)

## 2023-11-08 LAB — PHOSPHORUS
Phosphorus: 2 mg/dL — ABNORMAL LOW (ref 2.5–4.6)
Phosphorus: 1.6 mg/dL — ABNORMAL LOW (ref 2.5–4.6)
Phosphorus: 1 mg/dL — CL (ref 2.5–4.6)
Phosphorus: 1.8 mg/dL — ABNORMAL LOW (ref 2.5–4.6)

## 2023-11-08 LAB — BILIRUBIN, DIRECT
Bilirubin, Direct: 0.1 mg/dL (ref 0.0–0.2)
Bilirubin, Direct: 0.2 mg/dL (ref 0.0–0.2)
Bilirubin, Direct: 0.2 mg/dL (ref 0.0–0.2)
Bilirubin, Direct: 0.2 mg/dL (ref 0.0–0.2)

## 2023-11-08 LAB — MRSA CULTURE

## 2023-11-08 LAB — ECHOCARDIOGRAM COMPLETE
Area-P 1/2: 3.34 cm2
Est EF: >75
Height: 70 in
S' Lateral: 2.5 cm
Weight: 3382.74 [oz_av]

## 2023-11-08 LAB — APTT
aPTT: 34 s (ref 24–36)
aPTT: 36 s (ref 24–36)
aPTT: 33 s (ref 24–36)
aPTT: 33 s (ref 24–36)

## 2023-11-08 LAB — MAGNESIUM
Magnesium: 2.5 mg/dL — ABNORMAL HIGH (ref 1.7–2.4)
Magnesium: 2.6 mg/dL — ABNORMAL HIGH (ref 1.7–2.4)
Magnesium: 2.5 mg/dL — ABNORMAL HIGH (ref 1.7–2.4)
Magnesium: 2.5 mg/dL — ABNORMAL HIGH (ref 1.7–2.4)

## 2023-11-08 LAB — AMYLASE
Amylase: 12 U/L — ABNORMAL LOW (ref 28–100)
Amylase: 13 U/L — ABNORMAL LOW (ref 28–100)
Amylase: 11 U/L — ABNORMAL LOW (ref 28–100)
Amylase: 16 U/L — ABNORMAL LOW (ref 28–100)

## 2023-11-08 LAB — LACTIC ACID, PLASMA
Lactic Acid, Venous: 1.1 mmol/L (ref 0.5–1.9)
Lactic Acid, Venous: 1.2 mmol/L (ref 0.5–1.9)
Lactic Acid, Venous: 1.3 mmol/L (ref 0.5–1.9)
Lactic Acid, Venous: 1.8 mmol/L (ref 0.5–1.9)

## 2023-11-08 LAB — PREPARE RBC (CROSSMATCH)

## 2023-11-08 LAB — CALCIUM, IONIZED
Calcium, Ionized, Serum: 5 mg/dL (ref 4.5–5.6)
Calcium, Ionized, Serum: 4.8 mg/dL (ref 4.5–5.6)
Calcium, Ionized, Serum: 4.8 mg/dL (ref 4.5–5.6)
Calcium, Ionized, Serum: 4.7 mg/dL (ref 4.5–5.6)
Calcium, Ionized, Serum: 4.8 mg/dL (ref 4.5–5.6)

## 2023-11-08 LAB — CREATININE, SERUM
Creatinine, Ser: 1.1 mg/dL (ref 0.61–1.24)
GFR, Estimated: >60 mL/min (ref >60)

## 2023-11-08 LAB — LIPASE, BLOOD
Lipase: 12 U/L (ref 11–51)
Lipase: 12 U/L (ref 11–51)
Lipase: 15 U/L (ref 11–51)

## 2023-11-08 MED ORDER — K PHOS MONO-SOD PHOS DI & MONO 155-852-130 MG PO TABS
500.0000 mg | ORAL_TABLET | ORAL | Status: AC
  Administered 2023-11-08 – 2023-11-09 (×5): 500 mg
  Filled 2023-11-08 (×5): qty 2

## 2023-11-08 MED ORDER — POTASSIUM CHLORIDE 10 MEQ/50ML IV SOLN
10.0000 meq | INTRAVENOUS | Status: AC
  Administered 2023-11-08 (×6): 10 meq via INTRAVENOUS
  Filled 2023-11-08 (×6): qty 50

## 2023-11-08 MED ORDER — FUROSEMIDE 10 MG/ML IJ SOLN
40.0000 mg | Freq: Once | INTRAMUSCULAR | Status: AC
  Administered 2023-11-08: 40 mg via INTRAVENOUS
  Filled 2023-11-08: qty 4

## 2023-11-08 MED ORDER — POTASSIUM PHOSPHATES 15 MMOLE/5ML IV SOLN
30.0000 mmol | Freq: Once | INTRAVENOUS | Status: AC
  Administered 2023-11-08: 30 mmol via INTRAVENOUS
  Filled 2023-11-08: qty 10

## 2023-11-08 MED ORDER — TECHNETIUM TC 99M EXAMETAZIME IV KIT
20.0000 | PACK | Freq: Once | INTRAVENOUS | Status: AC | PRN
  Administered 2023-11-08: 21.9 via INTRAVENOUS

## 2023-11-08 MED ORDER — SODIUM CHLORIDE 0.9% IV SOLUTION: Freq: Once | INTRAVENOUS | Status: AC

## 2023-11-08 MED ORDER — KCL IN DEXTROSE-NACL 20-5-0.9 MEQ/L-%-% IV SOLN
INTRAVENOUS | Status: AC
  Filled 2023-11-08 (×2): qty 1000

## 2023-11-08 MED ORDER — ACETAMINOPHEN 500 MG PO TABS: 1000.0000 mg | ORAL_TABLET | Freq: Four times a day (QID) | ORAL | Status: DC | PRN

## 2023-11-08 NOTE — Progress Notes (Signed)
 Pt transported from ED29 to [facility] and back by RN and RT w/o complications

## 2023-11-08 NOTE — Progress Notes (Signed)
 Date and time results received: 11/01/2023 1839   Test: Hb  Critical Value: 6.9  Name of Provider Notified: [facility], Pt is Organ donor   Orders Received? Or Actions Taken?: see new orders

## 2023-11-08 NOTE — Progress Notes (Signed)
 Q 4 vent checks, increase peep to 20 for 20 seconds.  Increase Fi02 to 100% for thirty minutes and  obtained an ABG after; then back to original vent settings. (Q4)
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
## 2023-11-08 NOTE — Progress Notes (Signed)
 Patient with no cerebral blood flow on nuclear medicine scan.  Patient seen and examined.  Exam consistent with brain death with no reflexes.  Time of death declared to be 1710.    Deward JINNY Foy, MD General, Bariatric and Minimally Invasive Surgery [facility] Surgery - A [facility] [address]

## 2023-11-08 NOTE — Progress Notes (Incomplete)
 Per Arletta with [facility] titrate Vaso to 0.03 units/min.

## 2023-11-08 NOTE — Progress Notes (Signed)
 Patient with neurologic decline over the last 48 hours.  Patient with no eye-opening to noxious stimuli.  Pupils are fixed and dilated bilaterally.  Absent corneal reflexes.  No evidence of cough or gag response.  No spontaneous respirations above the ventilator.  Clinical examination consistent with irreversible cessation of neurologic function.  Confirmatory studies pending.

## 2023-11-08 NOTE — Progress Notes (Signed)
 No acute events overnight. Per Ozell with [facility], leave synthroid  gtt @ 20mcg/hr and vasopressin  gtt @ 0.04units/min overnight. Required low dose levophed  a few times overnight to maintain MAP >65, see MAR.

## 2023-11-09 ENCOUNTER — Inpatient Hospital Stay (HOSPITAL_COMMUNITY)

## 2023-11-09 ENCOUNTER — Encounter (HOSPITAL_COMMUNITY): Admission: EM | Disposition: E | Payer: Self-pay | Source: Home / Self Care

## 2023-11-09 ENCOUNTER — Encounter (HOSPITAL_COMMUNITY): Payer: Self-pay | Admitting: Cardiology

## 2023-11-09 DIAGNOSIS — G9382 Brain death: Secondary | ICD-10-CM | POA: Diagnosis not present

## 2023-11-09 DIAGNOSIS — J9601 Acute respiratory failure with hypoxia: Secondary | ICD-10-CM | POA: Diagnosis not present

## 2023-11-09 DIAGNOSIS — F331 Major depressive disorder, recurrent, moderate: Secondary | ICD-10-CM | POA: Diagnosis not present

## 2023-11-09 DIAGNOSIS — Z01810 Encounter for preprocedural cardiovascular examination: Secondary | ICD-10-CM | POA: Diagnosis not present

## 2023-11-09 DIAGNOSIS — F17210 Nicotine dependence, cigarettes, uncomplicated: Secondary | ICD-10-CM | POA: Diagnosis not present

## 2023-11-09 DIAGNOSIS — Z529 Donor of unspecified organ or tissue: Secondary | ICD-10-CM

## 2023-11-09 HISTORY — PX: ORGAN PROCUREMENT: SHX5270

## 2023-11-09 HISTORY — PX: RIGHT/LEFT HEART CATH AND CORONARY ANGIOGRAPHY: CATH118266

## 2023-11-09 LAB — CULTURE, BAL-QUANTITATIVE W GRAM STAIN: Culture: 20000 — AB

## 2023-11-09 LAB — COMPREHENSIVE METABOLIC PANEL WITH GFR
ALT: 24 U/L (ref 0–44)
ALT: 25 U/L (ref 0–44)
ALT: 25 U/L (ref 0–44)
AST: 22 U/L (ref 15–41)
AST: 25 U/L (ref 15–41)
AST: 27 U/L (ref 15–41)
Albumin: 1.8 g/dL — ABNORMAL LOW (ref 3.5–5.0)
Albumin: 1.9 g/dL — ABNORMAL LOW (ref 3.5–5.0)
Albumin: 2 g/dL — ABNORMAL LOW (ref 3.5–5.0)
Alkaline Phosphatase: 79 U/L (ref 38–126)
Alkaline Phosphatase: 83 U/L (ref 38–126)
Alkaline Phosphatase: 89 U/L (ref 38–126)
Anion gap: 11 (ref 5–15)
Anion gap: 12 (ref 5–15)
Anion gap: 13 (ref 5–15)
BUN: 39 mg/dL — ABNORMAL HIGH (ref 6–20)
BUN: 41 mg/dL — ABNORMAL HIGH (ref 6–20)
BUN: 44 mg/dL — ABNORMAL HIGH (ref 6–20)
CO2: 24 mmol/L (ref 22–32)
CO2: 25 mmol/L (ref 22–32)
CO2: 26 mmol/L (ref 22–32)
Calcium: 7.9 mg/dL — ABNORMAL LOW (ref 8.9–10.3)
Calcium: 8.3 mg/dL — ABNORMAL LOW (ref 8.9–10.3)
Calcium: 8.4 mg/dL — ABNORMAL LOW (ref 8.9–10.3)
Chloride: 117 mmol/L — ABNORMAL HIGH (ref 98–111)
Chloride: 124 mmol/L — ABNORMAL HIGH (ref 98–111)
Chloride: 127 mmol/L — ABNORMAL HIGH (ref 98–111)
Creatinine, Ser: 1.06 mg/dL (ref 0.61–1.24)
Creatinine, Ser: 1.33 mg/dL — ABNORMAL HIGH (ref 0.61–1.24)
Creatinine, Ser: 1.36 mg/dL — ABNORMAL HIGH (ref 0.61–1.24)
GFR, Estimated: >60 mL/min (ref >60)
GFR, Estimated: >60 mL/min (ref >60)
GFR, Estimated: >60 mL/min (ref >60)
Glucose, Bld: 160 mg/dL — ABNORMAL HIGH (ref 70–99)
Glucose, Bld: 162 mg/dL — ABNORMAL HIGH (ref 70–99)
Glucose, Bld: 179 mg/dL — ABNORMAL HIGH (ref 70–99)
Potassium: 3 mmol/L — ABNORMAL LOW (ref 3.5–5.1)
Potassium: 3.1 mmol/L — ABNORMAL LOW (ref 3.5–5.1)
Potassium: 3.3 mmol/L — ABNORMAL LOW (ref 3.5–5.1)
Sodium: 152 mmol/L — ABNORMAL HIGH (ref 135–145)
Sodium: 163 mmol/L (ref 135–145)
Sodium: 164 mmol/L (ref 135–145)
Total Bilirubin: 0.7 mg/dL (ref 0.0–1.2)
Total Bilirubin: 0.8 mg/dL (ref 0.0–1.2)
Total Bilirubin: 0.9 mg/dL (ref 0.0–1.2)
Total Protein: 5.4 g/dL — ABNORMAL LOW (ref 6.5–8.1)
Total Protein: 5.7 g/dL — ABNORMAL LOW (ref 6.5–8.1)
Total Protein: 6 g/dL — ABNORMAL LOW (ref 6.5–8.1)

## 2023-11-09 LAB — POCT I-STAT EG7
Acid-Base Excess: 2 mmol/L (ref 0.0–2.0)
Acid-Base Excess: 3 mmol/L — ABNORMAL HIGH (ref 0.0–2.0)
Bicarbonate: 27.7 mmol/L (ref 20.0–28.0)
Bicarbonate: 28.7 mmol/L — ABNORMAL HIGH (ref 20.0–28.0)
Calcium, Ion: 1.19 mmol/L (ref 1.15–1.40)
Calcium, Ion: 1.2 mmol/L (ref 1.15–1.40)
HCT: 25 % — ABNORMAL LOW (ref 39.0–52.0)
HCT: 26 % — ABNORMAL LOW (ref 39.0–52.0)
Hemoglobin: 8.5 g/dL — ABNORMAL LOW (ref 13.0–17.0)
Hemoglobin: 8.8 g/dL — ABNORMAL LOW (ref 13.0–17.0)
O2 Saturation: 74 %
O2 Saturation: 77 %
Potassium: 3.3 mmol/L — ABNORMAL LOW (ref 3.5–5.1)
Potassium: 3.3 mmol/L — ABNORMAL LOW (ref 3.5–5.1)
Sodium: 157 mmol/L — ABNORMAL HIGH (ref 135–145)
Sodium: 157 mmol/L — ABNORMAL HIGH (ref 135–145)
TCO2: 29 mmol/L (ref 22–32)
TCO2: 30 mmol/L (ref 22–32)
pCO2, Ven: 47.9 mmHg (ref 44–60)
pCO2, Ven: 49.2 mmHg (ref 44–60)
pH, Ven: 7.37 (ref 7.25–7.43)
pH, Ven: 7.374 (ref 7.25–7.43)
pO2, Ven: 41 mmHg (ref 32–45)
pO2, Ven: 44 mmHg (ref 32–45)

## 2023-11-09 LAB — POCT I-STAT 7, (LYTES, BLD GAS, ICA,H+H)
Acid-Base Excess: 1 mmol/L (ref 0.0–2.0)
Acid-Base Excess: 3 mmol/L — ABNORMAL HIGH (ref 0.0–2.0)
Acid-Base Excess: 4 mmol/L — ABNORMAL HIGH (ref 0.0–2.0)
Bicarbonate: 25.9 mmol/L (ref 20.0–28.0)
Bicarbonate: 27.8 mmol/L (ref 20.0–28.0)
Bicarbonate: 27.6 mmol/L (ref 20.0–28.0)
Calcium, Ion: 1.19 mmol/L (ref 1.15–1.40)
Calcium, Ion: 1.17 mmol/L (ref 1.15–1.40)
Calcium, Ion: 1.16 mmol/L (ref 1.15–1.40)
HCT: 25 % — ABNORMAL LOW (ref 39.0–52.0)
HCT: 51 % (ref 39.0–52.0)
HCT: 22 % — ABNORMAL LOW (ref 39.0–52.0)
Hemoglobin: 8.5 g/dL — ABNORMAL LOW (ref 13.0–17.0)
Hemoglobin: 17.3 g/dL — ABNORMAL HIGH (ref 13.0–17.0)
Hemoglobin: 7.5 g/dL — ABNORMAL LOW (ref 13.0–17.0)
O2 Saturation: 100 %
O2 Saturation: 100 %
O2 Saturation: 100 %
Patient temperature: 36.9
Potassium: 3.2 mmol/L — ABNORMAL LOW (ref 3.5–5.1)
Potassium: 3.2 mmol/L — ABNORMAL LOW (ref 3.5–5.1)
Potassium: 3 mmol/L — ABNORMAL LOW (ref 3.5–5.1)
Sodium: 156 mmol/L — ABNORMAL HIGH (ref 135–145)
Sodium: 164 mmol/L (ref 135–145)
Sodium: 164 mmol/L (ref 135–145)
TCO2: 27 mmol/L (ref 22–32)
TCO2: 29 mmol/L (ref 22–32)
TCO2: 29 mmol/L (ref 22–32)
pCO2 arterial: 42.6 mmHg (ref 32–48)
pCO2 arterial: 41.7 mmHg (ref 32–48)
pCO2 arterial: 36.2 mmHg (ref 32–48)
pH, Arterial: 7.392 (ref 7.35–7.45)
pH, Arterial: 7.431 (ref 7.35–7.45)
pH, Arterial: 7.49 — ABNORMAL HIGH (ref 7.35–7.45)
pO2, Arterial: 268 mmHg — ABNORMAL HIGH (ref 83–108)
pO2, Arterial: 291 mmHg — ABNORMAL HIGH (ref 83–108)
pO2, Arterial: 265 mmHg — ABNORMAL HIGH (ref 83–108)

## 2023-11-09 LAB — CALCIUM, IONIZED
Calcium, Ionized, Serum: 4.8 mg/dL (ref 4.5–5.6)
Calcium, Ionized, Serum: 5 mg/dL (ref 4.5–5.6)
Calcium, Ionized, Serum: 5.1 mg/dL (ref 4.5–5.6)

## 2023-11-09 LAB — PROTIME-INR
INR: 1.2 (ref 0.8–1.2)
INR: 1.3 — ABNORMAL HIGH (ref 0.8–1.2)
INR: 1.3 — ABNORMAL HIGH (ref 0.8–1.2)
Prothrombin Time: 16.2 s — ABNORMAL HIGH (ref 11.4–15.2)
Prothrombin Time: 16.6 s — ABNORMAL HIGH (ref 11.4–15.2)
Prothrombin Time: 17.3 s — ABNORMAL HIGH (ref 11.4–15.2)

## 2023-11-09 LAB — CBC
HCT: 25 % — ABNORMAL LOW (ref 39.0–52.0)
HCT: 27.3 % — ABNORMAL LOW (ref 39.0–52.0)
HCT: 27.8 % — ABNORMAL LOW (ref 39.0–52.0)
Hemoglobin: 8.1 g/dL — ABNORMAL LOW (ref 13.0–17.0)
Hemoglobin: 8.7 g/dL — ABNORMAL LOW (ref 13.0–17.0)
Hemoglobin: 8.8 g/dL — ABNORMAL LOW (ref 13.0–17.0)
MCH: 28.8 pg (ref 26.0–34.0)
MCH: 29.1 pg (ref 26.0–34.0)
MCH: 29.5 pg (ref 26.0–34.0)
MCHC: 31.7 g/dL (ref 30.0–36.0)
MCHC: 31.9 g/dL (ref 30.0–36.0)
MCHC: 32.4 g/dL (ref 30.0–36.0)
MCV: 90.8 fL (ref 80.0–100.0)
MCV: 90.9 fL (ref 80.0–100.0)
MCV: 91.3 fL (ref 80.0–100.0)
Platelets: 336 K/uL (ref 150–400)
Platelets: 385 K/uL (ref 150–400)
Platelets: 397 K/uL (ref 150–400)
RBC: 2.75 MIL/uL — ABNORMAL LOW (ref 4.22–5.81)
RBC: 2.99 MIL/uL — ABNORMAL LOW (ref 4.22–5.81)
RBC: 3.06 MIL/uL — ABNORMAL LOW (ref 4.22–5.81)
RDW: 15.7 % — ABNORMAL HIGH (ref 11.5–15.5)
RDW: 15.7 % — ABNORMAL HIGH (ref 11.5–15.5)
RDW: 15.8 % — ABNORMAL HIGH (ref 11.5–15.5)
WBC: 15.4 K/uL — ABNORMAL HIGH (ref 4.0–10.5)
WBC: 20.3 K/uL — ABNORMAL HIGH (ref 4.0–10.5)
WBC: 9.7 K/uL (ref 4.0–10.5)
nRBC: 0.3 % — ABNORMAL HIGH (ref 0.0–0.2)
nRBC: 0.4 % — ABNORMAL HIGH (ref 0.0–0.2)
nRBC: 0.5 % — ABNORMAL HIGH (ref 0.0–0.2)

## 2023-11-09 LAB — PREPARE RBC (CROSSMATCH)

## 2023-11-09 LAB — URINALYSIS, ROUTINE W REFLEX MICROSCOPIC
Bilirubin Urine: NEGATIVE
Glucose, UA: NEGATIVE mg/dL
Ketones, ur: NEGATIVE mg/dL
Leukocytes,Ua: NEGATIVE
Nitrite: NEGATIVE
Protein, ur: NEGATIVE mg/dL
Specific Gravity, Urine: 1.004 — ABNORMAL LOW (ref 1.005–1.030)
pH: 6 (ref 5.0–8.0)

## 2023-11-09 LAB — CK TOTAL AND CKMB (NOT AT ARMC)
CK, MB: 2.5 ng/mL (ref 0.5–5.0)
CK, MB: 2.7 ng/mL (ref 0.5–5.0)
CK, MB: 3.2 ng/mL (ref 0.5–5.0)
Total CK: 115 U/L (ref 49–397)
Total CK: 99 U/L (ref 49–397)
Total CK: 99 U/L (ref 49–397)

## 2023-11-09 LAB — ECHOCARDIOGRAM COMPLETE
AR max vel: 2.74 cm2
AV Area VTI: 2.63 cm2
AV Area mean vel: 2.67 cm2
AV Mean grad: 5 mmHg
AV Peak grad: 8.9 mmHg
Ao pk vel: 1.49 m/s
Area-P 1/2: 4.86 cm2
Calc EF: 69.7 %
Height: 70 in
MV VTI: 2.47 cm2
S' Lateral: 3.2 cm
Single Plane A2C EF: 76 %
Single Plane A4C EF: 62.8 %
Weight: 3283.97 [oz_av]

## 2023-11-09 LAB — PHOSPHORUS
Phosphorus: 2 mg/dL — ABNORMAL LOW (ref 2.5–4.6)
Phosphorus: 5.1 mg/dL — ABNORMAL HIGH (ref 2.5–4.6)
Phosphorus: 5.4 mg/dL — ABNORMAL HIGH (ref 2.5–4.6)

## 2023-11-09 LAB — GLUCOSE, CAPILLARY
Glucose-Capillary: 123 mg/dL — ABNORMAL HIGH (ref 70–99)
Glucose-Capillary: 132 mg/dL — ABNORMAL HIGH (ref 70–99)
Glucose-Capillary: 137 mg/dL — ABNORMAL HIGH (ref 70–99)
Glucose-Capillary: 138 mg/dL — ABNORMAL HIGH (ref 70–99)
Glucose-Capillary: 145 mg/dL — ABNORMAL HIGH (ref 70–99)
Glucose-Capillary: 148 mg/dL — ABNORMAL HIGH (ref 70–99)
Glucose-Capillary: 152 mg/dL — ABNORMAL HIGH (ref 70–99)
Glucose-Capillary: 163 mg/dL — ABNORMAL HIGH (ref 70–99)
Glucose-Capillary: 163 mg/dL — ABNORMAL HIGH (ref 70–99)
Glucose-Capillary: 163 mg/dL — ABNORMAL HIGH (ref 70–99)
Glucose-Capillary: 168 mg/dL — ABNORMAL HIGH (ref 70–99)

## 2023-11-09 LAB — AMYLASE
Amylase: 20 U/L — ABNORMAL LOW (ref 28–100)
Amylase: 23 U/L — ABNORMAL LOW (ref 28–100)
Amylase: 28 U/L (ref 28–100)

## 2023-11-09 LAB — LACTIC ACID, PLASMA
Lactic Acid, Venous: 1.2 mmol/L (ref 0.5–1.9)
Lactic Acid, Venous: 1.5 mmol/L (ref 0.5–1.9)
Lactic Acid, Venous: 2.2 mmol/L (ref 0.5–1.9)

## 2023-11-09 LAB — BILIRUBIN, DIRECT
Bilirubin, Direct: 0.1 mg/dL (ref 0.0–0.2)
Bilirubin, Direct: 0.1 mg/dL (ref 0.0–0.2)
Bilirubin, Direct: 0.2 mg/dL (ref 0.0–0.2)

## 2023-11-09 LAB — MAGNESIUM
Magnesium: 2.5 mg/dL — ABNORMAL HIGH (ref 1.7–2.4)
Magnesium: 2.7 mg/dL — ABNORMAL HIGH (ref 1.7–2.4)
Magnesium: 3 mg/dL — ABNORMAL HIGH (ref 1.7–2.4)

## 2023-11-09 LAB — CREATININE, SERUM
Creatinine, Ser: 1.35 mg/dL — ABNORMAL HIGH (ref 0.61–1.24)
GFR, Estimated: >60 mL/min (ref >60)

## 2023-11-09 LAB — APTT
aPTT: 28 s (ref 24–36)
aPTT: 28 s (ref 24–36)
aPTT: 31 s (ref 24–36)

## 2023-11-09 LAB — LIPASE, BLOOD
Lipase: 19 U/L (ref 11–51)
Lipase: 36 U/L (ref 11–51)
Lipase: 21 U/L (ref 11–51)

## 2023-11-09 LAB — POCT ACTIVATED CLOTTING TIME: Activated Clotting Time: 446 s

## 2023-11-09 MED ORDER — 0.9 % SODIUM CHLORIDE (POUR BTL) OPTIME
TOPICAL | Status: DC | PRN
  Administered 2023-11-09: 6000 mL

## 2023-11-09 MED ORDER — SODIUM CHLORIDE 0.9 % IV SOLN
1000.0000 mg | INTRAVENOUS | Status: AC
  Administered 2023-11-09: 1000 mg via INTRAVENOUS
  Filled 2023-11-09: qty 16

## 2023-11-09 MED ORDER — SODIUM CHLORIDE 0.9 % IV SOLN: 250.0000 mL | INTRAVENOUS | Status: DC | PRN

## 2023-11-09 MED ORDER — LEVOFLOXACIN IN D5W 500 MG/100ML IV SOLN
500.0000 mg | Freq: Every day | INTRAVENOUS | Status: DC
  Administered 2023-11-09: 500 mg via INTRAVENOUS
  Filled 2023-11-09: qty 100

## 2023-11-09 MED ORDER — HEPARIN SODIUM (PORCINE) 1000 UNIT/ML IJ SOLN
INTRAMUSCULAR | Status: DC | PRN
  Administered 2023-11-09: 30000 [IU] via INTRAVENOUS

## 2023-11-09 MED ORDER — LACTATED RINGERS IV SOLN: INTRAVENOUS | Status: DC | PRN

## 2023-11-09 MED ORDER — IOHEXOL 350 MG/ML SOLN
INTRAVENOUS | Status: DC | PRN
  Administered 2023-11-09: 45 mL via INTRA_ARTERIAL

## 2023-11-09 MED ORDER — VASOPRESSIN 20 UNITS/100 ML INFUSION FOR ORGAN DONOR
0.0000 [IU]/h | INTRAVENOUS | Status: DC
  Administered 2023-11-09: 1 [IU]/h via INTRAVENOUS
  Filled 2023-11-09: qty 100

## 2023-11-09 MED ORDER — HEPARIN (PORCINE) IN NACL 1000-0.9 UT/500ML-% IV SOLN
INTRAVENOUS | Status: DC | PRN
  Administered 2023-11-09 (×2): 500 mL

## 2023-11-09 MED ORDER — AZITHROMYCIN 500 MG IV SOLR
500.0000 mg | Freq: Every day | INTRAVENOUS | Status: DC
  Administered 2023-11-09: 500 mg via INTRAVENOUS
  Filled 2023-11-09: qty 5

## 2023-11-09 MED ORDER — PHENYLEPHRINE HCL (PRESSORS) 10 MG/ML IV SOLN
INTRAVENOUS | Status: DC | PRN
Start: 2023-11-09 — End: 2023-11-09
  Administered 2023-11-09 (×2): 160 ug via INTRAVENOUS

## 2023-11-09 MED ORDER — LIDOCAINE HCL (PF) 1 % IJ SOLN
INTRAMUSCULAR | Status: AC
Start: 2023-11-09 — End: 2023-11-09
  Filled 2023-11-09: qty 30

## 2023-11-09 MED ORDER — PIPERACILLIN-TAZOBACTAM 3.375 G IVPB
3.3750 g | Freq: Three times a day (TID) | INTRAVENOUS | Status: DC
  Administered 2023-11-09 (×2): 3.375 g via INTRAVENOUS
  Filled 2023-11-09 (×2): qty 50

## 2023-11-09 MED ORDER — SODIUM CHLORIDE 0.9% FLUSH: 3.0000 mL | INTRAVENOUS | Status: DC | PRN

## 2023-11-09 MED ORDER — ROCURONIUM BROMIDE 100 MG/10ML IV SOLN
INTRAVENOUS | Status: DC | PRN
  Administered 2023-11-09: 100 mg via INTRAVENOUS

## 2023-11-09 MED ORDER — LIDOCAINE HCL (PF) 1 % IJ SOLN
INTRAMUSCULAR | Status: DC | PRN
  Administered 2023-11-09: 15 mL

## 2023-11-09 MED ORDER — SODIUM CHLORIDE 0.9% FLUSH: 3.0000 mL | Freq: Two times a day (BID) | INTRAVENOUS | Status: DC

## 2023-11-09 MED ORDER — POTASSIUM PHOSPHATES 15 MMOLE/5ML IV SOLN
30.0000 mmol | Freq: Once | INTRAVENOUS | Status: AC
  Administered 2023-11-09: 30 mmol via INTRAVENOUS
  Filled 2023-11-09: qty 10

## 2023-11-09 MED ORDER — PHENYLEPHRINE 80 MCG/ML (10ML) SYRINGE FOR IV PUSH (FOR BLOOD PRESSURE SUPPORT)
PREFILLED_SYRINGE | INTRAVENOUS | Status: AC
  Filled 2023-11-09: qty 10

## 2023-11-10 ENCOUNTER — Encounter (HOSPITAL_COMMUNITY): Payer: Self-pay

## 2023-11-10 LAB — TYPE AND SCREEN
ABO/RH(D): A POS
Antibody Screen: NEGATIVE
PT AG Type: POSITIVE
Unit division: 0
Unit division: 0
Unit division: 0
Unit division: 0
Unit division: 0
Unit division: 0
Unit division: 0

## 2023-11-10 LAB — CALCIUM, IONIZED
Calcium, Ionized, Serum: 5.1 mg/dL (ref 4.5–5.6)
Calcium, Ionized, Serum: 5.1 mg/dL (ref 4.5–5.6)
Calcium, Ionized, Serum: 5.1 mg/dL (ref 4.5–5.6)
Calcium, Ionized, Serum: 4.9 mg/dL (ref 4.5–5.6)

## 2023-11-10 LAB — BPAM RBC
Blood Product Expiration Date: 202510232359
Blood Product Expiration Date: 202510232359
Blood Product Expiration Date: 202510232359
Blood Product Expiration Date: 202510232359
Blood Product Expiration Date: 202510232359
Blood Product Expiration Date: 202510162359
Blood Product Expiration Date: 202510172359
ISSUE DATE / TIME: 202509220051
ISSUE DATE / TIME: 202509231930
ISSUE DATE / TIME: 202509241853
ISSUE DATE / TIME: 202509241853
ISSUE DATE / TIME: 202509241853
ISSUE DATE / TIME: 202509241853
ISSUE DATE / TIME: 202509241853
Unit Type and Rh: 202510162359
Unit Type and Rh: 6200
Unit Type and Rh: 6200
Unit Type and Rh: 6200
Unit Type and Rh: 6200
Unit Type and Rh: 6200
Unit Type and Rh: 6200
Unit Type and Rh: 6200

## 2023-11-10 LAB — LIPOPROTEIN A (LPA): Lipoprotein (a): 39.8 nmol/L — ABNORMAL HIGH (ref <75.0)

## 2023-11-11 LAB — CULTURE, BLOOD (ROUTINE X 2)
Culture: NO GROWTH
Culture: NO GROWTH
Special Requests: ADEQUATE

## 2023-11-14 LAB — SURGICAL PATHOLOGY

## 2023-11-16 NOTE — OR Nursing (Signed)
 Cross clamp: 2019 Heart: 2031 Liver 2037 Kidney Right 2047 Kidney Left 2053

## 2023-11-16 NOTE — Progress Notes (Signed)
 Transported patient to cath lab while patient was on the mechanical ventilator. Patient remained stable during transport and procedure.
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
## 2023-11-16 NOTE — Transfer of Care (Signed)
 Immediate Anesthesia Transfer of Care Note  Patient: Joe Keith  Procedure(s) Performed: SURGICAL PROCUREMENT, ORGAN

## 2023-11-16 NOTE — Progress Notes (Signed)
 Patient was transported on the ventilator from 4N29 to CT & back with no problems.

## 2023-11-16 NOTE — Anesthesia Postprocedure Evaluation (Signed)
 Anesthesia Post Note  Patient: Joe Keith  Procedure(s) Performed: SURGICAL PROCUREMENT, ORGAN     Patient location during evaluation: Other Anesthesia Type: General Anesthetic complications: no Comments: Organ procurement 2/2 brain death.   No notable events documented.  Last Vitals:  Vitals:   11/09/23 1730 11/09/23 1800  BP:  112/60  Pulse: 67 66  Resp: (!) 26 (!) 26  Temp: 36.7 C 36.7 C  SpO2: 100% 99%    Last Pain:  Vitals:   11/09/23 0800  TempSrc: Esophageal                 [name]

## 2023-11-16 NOTE — Progress Notes (Signed)
 Recruitment maneuver performed per [facility] Q4. Patient then left on FIO2 100% for 30 minutes then draw ABG.

## 2023-11-16 NOTE — Progress Notes (Signed)
   11/09/23 0013  Spiritual Encounters  Type of Visit Attempt (pt unavailable)  Conversation partners present during encounter Nurse  Reason for visit Code  OnCall Visit Yes   Responded to code Stemi, advised chaplain not needed at this time team assembled for donor

## 2023-11-16 NOTE — Anesthesia Preprocedure Evaluation (Signed)
 Anesthesia Evaluation  Patient identified by MRN, date of birth, ID band Patient unresponsive    Airway Mallampati: Intubated       Dental   Pulmonary Current Smoker   Pulmonary exam normal        Cardiovascular  Rhythm:Regular Rate:Normal     Neuro/Psych Brain Death for organ procurement    GI/Hepatic   Endo/Other    Renal/GU      Musculoskeletal   Abdominal   Peds  Hematology   Anesthesia Other Findings   Reproductive/Obstetrics                              Anesthesia Physical Anesthesia Plan  ASA: 6  Anesthesia Plan: General   Post-op Pain Management:    Induction: Inhalational  PONV Risk Score and Plan:   Airway Management Planned: Oral ETT  Additional Equipment: Arterial line  Intra-op Plan:   Post-operative Plan:   Informed Consent: I have reviewed the patients History and Physical, chart, labs and discussed the procedure including the risks, benefits and alternatives for the proposed anesthesia with the patient or authorized representative who has indicated his/her understanding and acceptance.       Plan Discussed with: CRNA and Surgeon  Anesthesia Plan Comments:         Anesthesia Quick Evaluation

## 2023-11-16 DEATH — deceased
